# Patient Record
Sex: Male | Born: 1955 | Race: White | Hispanic: No | Marital: Married | Smoking: Never smoker
Health system: Southern US, Community
[De-identification: ages and names within clinical notes are randomized; demographics above are authoritative.]

## PROBLEM LIST (undated history)

## (undated) DIAGNOSIS — I219 Acute myocardial infarction, unspecified: Secondary | ICD-10-CM

## (undated) DIAGNOSIS — M549 Dorsalgia, unspecified: Secondary | ICD-10-CM

## (undated) DIAGNOSIS — T7840XA Allergy, unspecified, initial encounter: Secondary | ICD-10-CM

## (undated) DIAGNOSIS — M1990 Unspecified osteoarthritis, unspecified site: Secondary | ICD-10-CM

## (undated) DIAGNOSIS — G2581 Restless legs syndrome: Secondary | ICD-10-CM

## (undated) DIAGNOSIS — K219 Gastro-esophageal reflux disease without esophagitis: Secondary | ICD-10-CM

## (undated) DIAGNOSIS — F329 Major depressive disorder, single episode, unspecified: Secondary | ICD-10-CM

## (undated) DIAGNOSIS — E162 Hypoglycemia, unspecified: Secondary | ICD-10-CM

## (undated) DIAGNOSIS — F32A Depression, unspecified: Secondary | ICD-10-CM

## (undated) DIAGNOSIS — I509 Heart failure, unspecified: Secondary | ICD-10-CM

## (undated) DIAGNOSIS — G709 Myoneural disorder, unspecified: Secondary | ICD-10-CM

## (undated) DIAGNOSIS — G8929 Other chronic pain: Secondary | ICD-10-CM

## (undated) DIAGNOSIS — F419 Anxiety disorder, unspecified: Secondary | ICD-10-CM

## (undated) DIAGNOSIS — F4310 Post-traumatic stress disorder, unspecified: Secondary | ICD-10-CM

## (undated) HISTORY — DX: Gastro-esophageal reflux disease without esophagitis: K21.9

## (undated) HISTORY — PX: KNEE ARTHROSCOPY: SHX127

## (undated) HISTORY — PX: CERVICAL FUSION: SHX112

## (undated) HISTORY — DX: Other chronic pain: G89.29

## (undated) HISTORY — PX: SPINE SURGERY: SHX786

## (undated) HISTORY — DX: Acute myocardial infarction, unspecified: I21.9

## (undated) HISTORY — PX: ANKLE ARTHROSCOPY: SHX545

## (undated) HISTORY — DX: Restless legs syndrome: G25.81

## (undated) HISTORY — PX: LUMBAR FUSION: SHX111

## (undated) HISTORY — PX: HERNIA REPAIR: SHX51

## (undated) HISTORY — DX: Dorsalgia, unspecified: M54.9

## (undated) HISTORY — DX: Hypoglycemia, unspecified: E16.2

## (undated) HISTORY — DX: Post-traumatic stress disorder, unspecified: F43.10

## (undated) HISTORY — PX: NISSEN FUNDOPLICATION: SHX2091

## (undated) HISTORY — DX: Depression, unspecified: F32.A

## (undated) HISTORY — DX: Unspecified osteoarthritis, unspecified site: M19.90

## (undated) HISTORY — DX: Allergy, unspecified, initial encounter: T78.40XA

## (undated) HISTORY — PX: VASECTOMY: SHX75

## (undated) HISTORY — DX: Heart failure, unspecified: I50.9

## (undated) HISTORY — DX: Major depressive disorder, single episode, unspecified: F32.9

## (undated) HISTORY — DX: Anxiety disorder, unspecified: F41.9

## (undated) HISTORY — DX: Myoneural disorder, unspecified: G70.9

---

## 1998-03-14 HISTORY — PX: CHOLECYSTECTOMY: SHX55

## 2012-05-31 ENCOUNTER — Encounter: Payer: Self-pay | Admitting: Internal Medicine

## 2012-05-31 ENCOUNTER — Other Ambulatory Visit (INDEPENDENT_AMBULATORY_CARE_PROVIDER_SITE_OTHER): Payer: Federal, State, Local not specified - PPO

## 2012-05-31 ENCOUNTER — Ambulatory Visit (INDEPENDENT_AMBULATORY_CARE_PROVIDER_SITE_OTHER): Payer: Federal, State, Local not specified - PPO | Admitting: Internal Medicine

## 2012-05-31 VITALS — BP 118/82 | HR 78 | Temp 97.9°F | Wt 256.0 lb

## 2012-05-31 DIAGNOSIS — Z125 Encounter for screening for malignant neoplasm of prostate: Secondary | ICD-10-CM

## 2012-05-31 DIAGNOSIS — Z0000 Encounter for general adult medical examination without abnormal findings: Secondary | ICD-10-CM

## 2012-05-31 DIAGNOSIS — Z13220 Encounter for screening for lipoid disorders: Secondary | ICD-10-CM

## 2012-05-31 DIAGNOSIS — Z131 Encounter for screening for diabetes mellitus: Secondary | ICD-10-CM

## 2012-05-31 DIAGNOSIS — Z130 Encounter for screening for diseases of the blood and blood-forming organs and certain disorders involving the immune mechanism: Secondary | ICD-10-CM

## 2012-05-31 LAB — PSA: PSA: 1.86 ng/mL (ref 0.10–4.00)

## 2012-05-31 LAB — BASIC METABOLIC PANEL
BUN: 16 mg/dL (ref 6–23)
Chloride: 102 mEq/L (ref 96–112)
Creatinine, Ser: 1.1 mg/dL (ref 0.4–1.5)
GFR: 73.82 mL/min (ref >60.00)
Potassium: 3.8 mEq/L (ref 3.5–5.1)

## 2012-05-31 LAB — LIPID PANEL
Cholesterol: 145 mg/dL (ref 0–200)
LDL Cholesterol: 101 mg/dL — ABNORMAL HIGH (ref 0–99)
Triglycerides: 65 mg/dL (ref 0.0–149.0)
VLDL: 13 mg/dL (ref 0.0–40.0)

## 2012-05-31 LAB — CBC
MCHC: 33.7 g/dL (ref 30.0–36.0)
MCV: 87.5 fl (ref 78.0–100.0)
Platelets: 276 10*3/uL (ref 150.0–400.0)
RDW: 12.9 % (ref 11.5–14.6)

## 2012-05-31 MED ORDER — ZOLPIDEM TARTRATE ER 12.5 MG PO TBCR: 12.5000 mg | EXTENDED_RELEASE_TABLET | Freq: Every evening | ORAL | Status: DC | PRN

## 2012-05-31 MED ORDER — HYOSCYAMINE SULFATE 0.125 MG PO TABS: 0.1250 mg | ORAL_TABLET | Freq: Three times a day (TID) | ORAL | Status: DC | PRN

## 2012-05-31 MED ORDER — DIAZEPAM 10 MG PO TABS: 10.0000 mg | ORAL_TABLET | Freq: Three times a day (TID) | ORAL | Status: DC | PRN

## 2012-05-31 MED ORDER — PROMETHAZINE HCL 25 MG PO TABS: 25.0000 mg | ORAL_TABLET | Freq: Two times a day (BID) | ORAL | Status: DC | PRN

## 2012-05-31 MED ORDER — METHADONE HCL 10 MG PO TABS: 10.0000 mg | ORAL_TABLET | Freq: Four times a day (QID) | ORAL | Status: DC | PRN

## 2012-05-31 NOTE — Patient Instructions (Signed)
Health Maintenance, Males A healthy lifestyle and preventative care can promote health and wellness.  Maintain regular health, dental, and eye exams.  Eat a healthy diet. Foods like vegetables, fruits, whole grains, low-fat dairy products, and lean protein foods contain the nutrients you need without too many calories. Decrease your intake of foods high in solid fats, added sugars, and salt. Get information about a proper diet from your caregiver, if necessary.  Regular physical exercise is one of the most important things you can do for your health. Most adults should get at least 150 minutes of moderate-intensity exercise (any activity that increases your heart rate and causes you to sweat) each week. In addition, most adults need muscle-strengthening exercises on 2 or more days a week.   Maintain a healthy weight. The body mass index (BMI) is a screening tool to identify possible weight problems. It provides an estimate of body fat based on height and weight. Your caregiver can help determine your BMI, and can help you achieve or maintain a healthy weight. For adults 20 years and older:  A BMI below 18.5 is considered underweight.  A BMI of 18.5 to 24.9 is normal.  A BMI of 25 to 29.9 is considered overweight.  A BMI of 30 and above is considered obese.  Maintain normal blood lipids and cholesterol by exercising and minimizing your intake of saturated fat. Eat a balanced diet with plenty of fruits and vegetables. Blood tests for lipids and cholesterol should begin at age 20 and be repeated every 5 years. If your lipid or cholesterol levels are high, you are over 50, or you are a high risk for heart disease, you may need your cholesterol levels checked more frequently.Ongoing high lipid and cholesterol levels should be treated with medicines, if diet and exercise are not effective.  If you smoke, find out from your caregiver how to quit. If you do not use tobacco, do not start.  If you  choose to drink alcohol, do not exceed 2 drinks per day. One drink is considered to be 12 ounces (355 mL) of beer, 5 ounces (148 mL) of wine, or 1.5 ounces (44 mL) of liquor.  Avoid use of street drugs. Do not share needles with anyone. Ask for help if you need support or instructions about stopping the use of drugs.  High blood pressure causes heart disease and increases the risk of stroke. Blood pressure should be checked at least every 1 to 2 years. Ongoing high blood pressure should be treated with medicines if weight loss and exercise are not effective.  If you are 45 to 55 years old, ask your caregiver if you should take aspirin to prevent heart disease.  Diabetes screening involves taking a blood sample to check your fasting blood sugar level. This should be done once every 3 years, after age 45, if you are within normal weight and without risk factors for diabetes. Testing should be considered at a younger age or be carried out more frequently if you are overweight and have at least 1 risk factor for diabetes.  Colorectal cancer can be detected and often prevented. Most routine colorectal cancer screening begins at the age of 50 and continues through age 75. However, your caregiver may recommend screening at an earlier age if you have risk factors for colon cancer. On a yearly basis, your caregiver may provide home test kits to check for hidden blood in the stool. Use of a small camera at the end of a tube,   to directly examine the colon (sigmoidoscopy or colonoscopy), can detect the earliest forms of colorectal cancer. Talk to your caregiver about this at age 50, when routine screening begins. Direct examination of the colon should be repeated every 5 to 10 years through age 75, unless early forms of pre-cancerous polyps or small growths are found.  Hepatitis C blood testing is recommended for all people born from 1945 through 1965 and any individual with known risks for hepatitis C.  Healthy  men should no longer receive prostate-specific antigen (PSA) blood tests as part of routine cancer screening. Consult with your caregiver about prostate cancer screening.  Testicular cancer screening is not recommended for adolescents or adult males who have no symptoms. Screening includes self-exam, caregiver exam, and other screening tests. Consult with your caregiver about any symptoms you have or any concerns you have about testicular cancer.  Practice safe sex. Use condoms and avoid high-risk sexual practices to reduce the spread of sexually transmitted infections (STIs).  Use sunscreen with a sun protection factor (SPF) of 30 or greater. Apply sunscreen liberally and repeatedly throughout the day. You should seek shade when your shadow is shorter than you. Protect yourself by wearing long sleeves, pants, a wide-brimmed hat, and sunglasses year round, whenever you are outdoors.  Notify your caregiver of new moles or changes in moles, especially if there is a change in shape or color. Also notify your caregiver if a mole is larger than the size of a pencil eraser.  A one-time screening for abdominal aortic aneurysm (AAA) and surgical repair of large AAAs by sound wave imaging (ultrasonography) is recommended for ages 65 to 75 years who are current or former smokers.  Stay current with your immunizations. Document Released: 08/27/2007 Document Revised: 05/23/2011 Document Reviewed: 07/26/2010 [employer] Patient Information 2013 [employer].  

## 2012-05-31 NOTE — Assessment & Plan Note (Signed)
Well controlled on current therapy Hycosamine refilled

## 2012-05-31 NOTE — Assessment & Plan Note (Signed)
Well controlled on current therapy Refilled Ambien today

## 2012-05-31 NOTE — Assessment & Plan Note (Signed)
Not well controlled Refilled Valium Reassurance given Pt offered referral to counseling for CBT

## 2012-05-31 NOTE — Progress Notes (Signed)
HPI  Pt presents to the clinic today to establish care. He recently moved here from [facility]. He needs refills of all of his medications but other than that he has no concerns. He is on Methadone QID for chronic back pain. He does take Valium TID for PTSD after retiring from the [employer]. He is fairly stressed right now secondary to a difficult family situation. He would be interested in speaking with a therapist regarding his family situation.  Flu: never Tetanus: more than 10 years: Dentist: as needed Ey doctor: not regualarly Colonoscopy: 2011 (due 2016)  Past Medical History  Diagnosis Date  . Arthritis   . Depression   . Allergy   . Hypoglycemia     Current Outpatient Prescriptions  Medication Sig Dispense Refill  . diazepam (VALIUM) 10 MG tablet Take 1 tablet (10 mg total) by mouth every 8 (eight) hours as needed for anxiety.  90 tablet  3  . hyoscyamine (LEVSIN, ANASPAZ) 0.125 MG tablet Take 1 tablet (0.125 mg total) by mouth 3 (three) times daily as needed for cramping.  30 tablet  3  . methadone (DOLOPHINE) 10 MG tablet Take 1 tablet (10 mg total) by mouth every 6 (six) hours as needed for pain.  120 tablet  0  . promethazine (PHENERGAN) 25 MG tablet Take 1 tablet (25 mg total) by mouth every 12 (twelve) hours as needed for nausea.  30 tablet  2  . zolpidem (AMBIEN CR) 12.5 MG CR tablet Take 1 tablet (12.5 mg total) by mouth at bedtime as needed for sleep.  30 tablet  3   No current facility-administered medications for this visit.    Allergies  Allergen Reactions  . Antihistamines, Chlorpheniramine-Type   . Decongestant (Pseudoephedrine)   . Penicillins     Family History  Problem Relation Age of Onset  . Breast cancer Mother   . Stroke Father   . Hypertension Father   . Diabetes Father   . Alzheimer's disease Father     History   Social History  . Marital Status: Married    Spouse Name: N/A    Number of Children: N/A  . Years of Education: 16    Occupational History  . Retired     Retired Recruitment consultant   Social History Main Topics  . Smoking status: Never Smoker   . Smokeless tobacco: Never Used  . Alcohol Use: Yes  . Drug Use: No  . Sexually Active: Yes    Birth Control/ Protection: Surgical   Other Topics Concern  . Not on file   Social History Narrative  . No narrative on file    ROS:  Constitutional: Pt reports fatigue.Denies fever, malaise, fatigue, headache or abrupt weight changes.  HEENT: Denies eye pain, eye redness, ear pain, ringing in the ears, wax buildup, runny nose, nasal congestion, bloody nose, or sore throat. Respiratory: Denies difficulty breathing, shortness of breath, cough or sputum production.   Cardiovascular: Denies chest pain, chest tightness, palpitations or swelling in the hands or feet.  Gastrointestinal: Denies abdominal pain, bloating, constipation, diarrhea or blood in the stool.  GU: Denies frequency, urgency, pain with urination, blood in urine, odor or discharge. Musculoskeletal: Pt reports chronic back pain. Denies decrease in range of motion, difficulty with gait, muscle pain or joint pain and swelling.  Skin: Denies redness, rashes, lesions or ulcercations.  Neurological: Denies dizziness, difficulty with memory, difficulty with speech or problems with balance and coordination.   No other specific complaints in a  complete review of systems (except as listed in HPI above).  PE:  BP 118/82  Pulse 78  Temp(Src) 97.9 F (36.6 C) (Oral)  Wt 256 lb (116.121 kg)  SpO2 97% Wt Readings from Last 3 Encounters:  05/31/12 256 lb (116.121 kg)    General: Appears his stated age, well developed, well nourished in NAD. HEENT: Head: normal shape and size; Eyes: sclera white, no icterus, conjunctiva pink, PERRLA and EOMs intact; Ears: Tm's gray and intact, normal light reflex; Nose: mucosa pink and moist, septum midline; Throat/Mouth: Teeth present, mucosa pink and moist, no lesions  or ulcerations noted.  Neck: Decreased range of motion secondary to pain. Neck supple, trachea midline. No massses, lumps or thyromegaly present.  Cardiovascular: Normal rate and rhythm. S1,S2 noted.  No murmur, rubs or gallops noted. No JVD or BLE edema. No carotid bruits noted. Pulmonary/Chest: Normal effort and positive vesicular breath sounds. No respiratory distress. No wheezes, rales or ronchi noted.  Abdomen: Soft and nontender. Normal bowel sounds, no bruits noted. No distention or masses noted. Liver, spleen and kidneys non palpable. Musculoskeletal:Decreased flexion of the back secondary to pain. No signs of joint swelling. No difficulty with gait.  Neurological: Alert and oriented. Cranial nerves II-XII intact. Coordination normal. +DTRs bilaterally. Psychiatric: Mood and affect normal. Behavior is normal. Judgment and thought content normal.     Assessment and Plan:  Preventative health Maintenance:  Will obtain basic screening labs today Start a diet and exercise program Pt declines flu and tdap today Will refer eye doctor

## 2012-05-31 NOTE — Assessment & Plan Note (Signed)
Refilled Methadone Controlled on current therapy Will give 120 pills each month, no early refills, no dose increase

## 2012-05-31 NOTE — Assessment & Plan Note (Signed)
Phenergan refilled.

## 2012-07-02 ENCOUNTER — Other Ambulatory Visit: Payer: Self-pay | Admitting: Internal Medicine

## 2012-07-02 ENCOUNTER — Telehealth: Payer: Self-pay | Admitting: Internal Medicine

## 2012-07-02 DIAGNOSIS — M549 Dorsalgia, unspecified: Secondary | ICD-10-CM

## 2012-07-02 DIAGNOSIS — R110 Nausea: Secondary | ICD-10-CM

## 2012-07-02 MED ORDER — PROMETHAZINE HCL 25 MG PO TABS: 25.0000 mg | ORAL_TABLET | Freq: Two times a day (BID) | ORAL | Status: DC | PRN

## 2012-07-02 MED ORDER — METHADONE HCL 10 MG PO TABS: 10.0000 mg | ORAL_TABLET | Freq: Four times a day (QID) | ORAL | Status: DC | PRN

## 2012-07-02 NOTE — Telephone Encounter (Signed)
Rx sent, pt informed. 

## 2012-07-02 NOTE — Telephone Encounter (Signed)
Needs 30 day supply of phenergan called [facility] [address].  Please call patient to let know it has been sent.

## 2012-07-30 ENCOUNTER — Other Ambulatory Visit: Payer: Self-pay | Admitting: Internal Medicine

## 2012-07-30 DIAGNOSIS — G8929 Other chronic pain: Secondary | ICD-10-CM

## 2012-07-30 MED ORDER — METHADONE HCL 10 MG PO TABS: 10.0000 mg | ORAL_TABLET | Freq: Four times a day (QID) | ORAL | Status: DC | PRN

## 2012-08-30 ENCOUNTER — Encounter: Payer: Self-pay | Admitting: Internal Medicine

## 2012-08-30 ENCOUNTER — Ambulatory Visit (INDEPENDENT_AMBULATORY_CARE_PROVIDER_SITE_OTHER): Payer: Federal, State, Local not specified - PPO | Admitting: Internal Medicine

## 2012-08-30 VITALS — BP 108/82 | HR 95 | Temp 98.0°F | Ht 73.0 in | Wt 257.0 lb

## 2012-08-30 DIAGNOSIS — R110 Nausea: Secondary | ICD-10-CM

## 2012-08-30 DIAGNOSIS — F411 Generalized anxiety disorder: Secondary | ICD-10-CM

## 2012-08-30 DIAGNOSIS — R109 Unspecified abdominal pain: Secondary | ICD-10-CM

## 2012-08-30 DIAGNOSIS — G8929 Other chronic pain: Secondary | ICD-10-CM

## 2012-08-30 DIAGNOSIS — J309 Allergic rhinitis, unspecified: Secondary | ICD-10-CM

## 2012-08-30 DIAGNOSIS — G4700 Insomnia, unspecified: Secondary | ICD-10-CM

## 2012-08-30 DIAGNOSIS — M549 Dorsalgia, unspecified: Secondary | ICD-10-CM

## 2012-08-30 MED ORDER — METHADONE HCL 10 MG PO TABS: 10.0000 mg | ORAL_TABLET | Freq: Four times a day (QID) | ORAL | Status: DC | PRN

## 2012-08-30 MED ORDER — PROMETHAZINE HCL 25 MG PO TABS: 25.0000 mg | ORAL_TABLET | Freq: Two times a day (BID) | ORAL | Status: DC | PRN

## 2012-08-30 MED ORDER — ZOLPIDEM TARTRATE ER 12.5 MG PO TBCR: 12.5000 mg | EXTENDED_RELEASE_TABLET | Freq: Every evening | ORAL | Status: DC | PRN

## 2012-08-30 MED ORDER — HYOSCYAMINE SULFATE 0.125 MG PO TABS: 0.1250 mg | ORAL_TABLET | Freq: Three times a day (TID) | ORAL | Status: DC | PRN

## 2012-08-30 MED ORDER — METHYLPREDNISOLONE ACETATE 80 MG/ML IJ SUSP
80.0000 mg | Freq: Once | INTRAMUSCULAR | Status: AC
  Administered 2012-08-30: 80 mg via INTRAMUSCULAR

## 2012-08-30 MED ORDER — DIAZEPAM 10 MG PO TABS: 10.0000 mg | ORAL_TABLET | Freq: Three times a day (TID) | ORAL | Status: DC | PRN

## 2012-08-30 NOTE — Progress Notes (Signed)
Subjective:    Patient ID: Antonio Cabrera, male    DOB: Oct 22, 1955, 57 y.o.   MRN: [id number]  HPI  Pt presents to the clinic today for 3 month f/u of chronic medical conditions.  1- Insomnia: not well controlled on ambien but has tried others, too expensive. He thinks this is related to worsening stress and anxiety. 2- chronic back pain: on methadone, doing well at this time, no complaints. 3- GAD:  On valium. Feels like it is slightly worse secondary to stress. He has no interest is seeing a therapist. 4- Abdominal cramping: well controlled on hycosamine and phenergan   Additionally, he c/o of sinus issues. He c/o fatigue, headache, runny nose with clear drainage and nagging cough. He does have a history of allergies. He is not currently on any antihistamine. He denies fever, chills or body aches. He has not had sick contacts.  Review of Systems      Past Medical History  Diagnosis Date  . Arthritis   . Depression   . Allergy   . Hypoglycemia     Current Outpatient Prescriptions  Medication Sig Dispense Refill  . diazepam (VALIUM) 10 MG tablet Take 1 tablet (10 mg total) by mouth every 8 (eight) hours as needed for anxiety.  90 tablet  3  . hyoscyamine (LEVSIN, ANASPAZ) 0.125 MG tablet Take 1 tablet (0.125 mg total) by mouth 3 (three) times daily as needed for cramping.  30 tablet  3  . methadone (DOLOPHINE) 10 MG tablet Take 1 tablet (10 mg total) by mouth every 6 (six) hours as needed for pain.  120 tablet  0  . promethazine (PHENERGAN) 25 MG tablet Take 1 tablet (25 mg total) by mouth every 12 (twelve) hours as needed for nausea.  30 tablet  1  . zolpidem (AMBIEN CR) 12.5 MG CR tablet Take 1 tablet (12.5 mg total) by mouth at bedtime as needed for sleep.  30 tablet  3   No current facility-administered medications for this visit.    Allergies  Allergen Reactions  . Antihistamines, Chlorpheniramine-Type   . Decongestant (Pseudoephedrine)   . Penicillins     Family  History  Problem Relation Age of Onset  . Breast cancer Mother   . Stroke Father   . Hypertension Father   . Diabetes Father   . Alzheimer's disease Father     History   Social History  . Marital Status: Married    Spouse Name: N/A    Number of Children: N/A  . Years of Education: 16   Occupational History  . Retired     Retired Recruitment consultant   Social History Main Topics  . Smoking status: Never Smoker   . Smokeless tobacco: Never Used  . Alcohol Use: Yes  . Drug Use: No  . Sexually Active: Yes    Birth Control/ Protection: Surgical   Other Topics Concern  . Not on file   Social History Narrative  . No narrative on file     Constitutional: Pt reports fatigue and headache. Denies fever, malaise, or abrupt weight changes.  HEENT: Pt reports runny nose. Denies eye pain, eye redness, ear pain, ringing in the ears, wax buildup,  nasal congestion, bloody nose, or sore throat. Respiratory: Pt reports cough.Denies difficulty breathing, shortness of breath, or sputum production.   Cardiovascular: Denies chest pain, chest tightness, palpitations or swelling in the hands or feet.  Gastrointestinal: Denies abdominal pain, bloating, constipation, diarrhea or blood in the stool. Marland Kitchen  Musculoskeletal: Pt reports back pain. Denies decrease in range of motion, difficulty with gait, muscle pain or joint pain and swelling.  Skin: Denies redness, rashes, lesions or ulcercations.  Neurological: Denies dizziness, difficulty with memory, difficulty with speech or problems with balance and coordination.  Psych: Pt reports insomnia and anxiety. Denies SI/HI.  No other specific complaints in a complete review of systems (except as listed in HPI above).  Objective:   Physical Exam    BP 108/82  Pulse 95  Temp(Src) 98 F (36.7 C) (Oral)  Ht 6\' 1"  (1.854 m)  Wt 257 lb (116.574 kg)  BMI 33.91 kg/m2  SpO2 97% Wt Readings from Last 3 Encounters:  08/30/12 257 lb (116.574 kg)   05/31/12 256 lb (116.121 kg)    General: Appears his stated age, well developed, well nourished in NAD. Skin: Warm, dry and intact. No rashes, lesions or ulcerations noted. HEENT: Head: normal shape and size; Eyes: sclera white, no icterus, conjunctiva pink, PERRLA and EOMs intact; Ears: Tm's gray and intact, normal light reflex; Nose: mucosa pink and moist, septum midline; Throat/Mouth: Teeth present, mucosa pink and moist, no exudate, lesions or ulcerations noted.  Neck: Normal range of motion. Neck supple, trachea midline. No massses, lumps or thyromegaly present.  Cardiovascular: Normal rate and rhythm. S1,S2 noted.  No murmur, rubs or gallops noted. No JVD or BLE edema. No carotid bruits noted. Pulmonary/Chest: Normal effort and positive vesicular breath sounds. No respiratory distress. No wheezes, rales or ronchi noted.  Abdomen: Soft and nontender. Normal bowel sounds, no bruits noted. No distention or masses noted. Liver, spleen and kidneys non palpable. Musculoskeletal: Decreased flexion and extension of the back secondary to pain. No signs of joint swelling. No difficulty with gait.  Neurological: Alert and oriented. Cranial nerves II-XII intact. Coordination normal. +DTRs bilaterally. Psychiatric: Mood anxious and affect normal. Behavior is normal. Judgment and thought content normal.    BMET    Component Value Date/Time   NA 138 05/31/2012 1124   K 3.8 05/31/2012 1124   CL 102 05/31/2012 1124   CO2 28 05/31/2012 1124   GLUCOSE 131* 05/31/2012 1124   BUN 16 05/31/2012 1124   CREATININE 1.1 05/31/2012 1124   CALCIUM 8.9 05/31/2012 1124    Lipid Panel     Component Value Date/Time   CHOL 145 05/31/2012 1124   TRIG 65.0 05/31/2012 1124   HDL 30.70* 05/31/2012 1124   CHOLHDL 5 05/31/2012 1124   VLDL 13.0 05/31/2012 1124   LDLCALC 101* 05/31/2012 1124    CBC    Component Value Date/Time   WBC 7.6 05/31/2012 1124   RBC 5.26 05/31/2012 1124   HGB 15.5 05/31/2012 1124   HCT 46.0  05/31/2012 1124   PLT 276.0 05/31/2012 1124   MCV 87.5 05/31/2012 1124   MCHC 33.7 05/31/2012 1124   RDW 12.9 05/31/2012 1124    Hgb A1C Lab Results  Component Value Date   HGBA1C 5.5 05/31/2012        Assessment & Plan:   Allergic Rhinitis:  Take an OTC antihistamine such as Zyrtec No s/s of infection at this time Will give 80 mg Depo IM today

## 2012-08-30 NOTE — Patient Instructions (Signed)

## 2012-08-31 NOTE — Assessment & Plan Note (Signed)
Well controlled  Continue current meds- refilled today 

## 2012-08-31 NOTE — Assessment & Plan Note (Signed)
Appear to be worse secondary to stress Support given Offered referral for CBT, pt declines Continue current meds

## 2012-08-31 NOTE — Assessment & Plan Note (Signed)
Well controlled  Continue current meds- refilled today

## 2012-08-31 NOTE — Assessment & Plan Note (Signed)
Not well controlled Pt would not try a different medication secondary to cost Continue ambien-refilled today

## 2012-09-07 ENCOUNTER — Other Ambulatory Visit: Payer: Self-pay | Admitting: *Deleted

## 2012-09-07 ENCOUNTER — Telehealth: Payer: Self-pay | Admitting: Internal Medicine

## 2012-09-07 DIAGNOSIS — R110 Nausea: Secondary | ICD-10-CM

## 2012-09-07 MED ORDER — PROMETHAZINE HCL 25 MG PO TABS: 25.0000 mg | ORAL_TABLET | Freq: Two times a day (BID) | ORAL | Status: DC | PRN

## 2012-09-07 NOTE — Telephone Encounter (Signed)
Ok to refill 

## 2012-09-07 NOTE — Telephone Encounter (Signed)
Refilled medication as per Nicki Reaper, NP

## 2012-09-07 NOTE — Telephone Encounter (Signed)
Pt just had the phenegran filled on Sunday.  Pt states he knocked the open bottle in the sink and water got in and disolved the medicine.  He says this is only the second time he has had this to happen.  He is requesting a new RX for it.

## 2012-10-01 ENCOUNTER — Telehealth: Payer: Self-pay | Admitting: *Deleted

## 2012-10-01 ENCOUNTER — Other Ambulatory Visit: Payer: Self-pay | Admitting: Internal Medicine

## 2012-10-01 DIAGNOSIS — M549 Dorsalgia, unspecified: Secondary | ICD-10-CM

## 2012-10-01 DIAGNOSIS — G4700 Insomnia, unspecified: Secondary | ICD-10-CM

## 2012-10-01 MED ORDER — ZOLPIDEM TARTRATE ER 12.5 MG PO TBCR: 12.5000 mg | EXTENDED_RELEASE_TABLET | Freq: Every evening | ORAL | Status: DC | PRN

## 2012-10-01 MED ORDER — METHADONE HCL 10 MG PO TABS: 10.0000 mg | ORAL_TABLET | Freq: Four times a day (QID) | ORAL | Status: DC | PRN

## 2012-10-01 NOTE — Telephone Encounter (Signed)
Prior auth for Ambien CR 12.5mg  has been initiated & completed over the phone. The approval dates are 6.16.14 through 6.16.15.

## 2012-10-27 ENCOUNTER — Other Ambulatory Visit: Payer: Self-pay | Admitting: Internal Medicine

## 2012-10-29 ENCOUNTER — Telehealth: Payer: Self-pay | Admitting: *Deleted

## 2012-10-29 NOTE — Telephone Encounter (Signed)
[facility] Triage Call Report Triage Record Num: [id number] Operator: Valene Bors Patient Name: Antonio Cabrera Call Date & Time: 10/27/2012 3:04:17PM Patient Phone: [phone] PCP: Nicki Reaper Patient Gender: Male PCP Fax : Patient DOB: January 28, 1956 Practice Name: [facility] Reason for Call: Caller: [name]; PCP: Nicki Reaper; CB#: [phone]; Call regarding needing refill on Phenergan 25 mgs 1 PO every 4 hours prn; Last seen in the office on 08/30/12. Medication Questions Protocol and #12, no refills [facility] [address] and spoke to [place] Protocol(s) Used: Medication Questions - Adult Recommended Outcome per Protocol: Speak with Provider or Pharmacist within 24 hours Reason for Outcome: Requests refill of prescribed medication with valid refills; lack of medications does not put patient at clinical risk Care Advice: ~ 08/

## 2012-10-29 NOTE — Telephone Encounter (Signed)
I refilled this am.

## 2012-10-31 ENCOUNTER — Other Ambulatory Visit: Payer: Self-pay | Admitting: Internal Medicine

## 2012-10-31 DIAGNOSIS — G8929 Other chronic pain: Secondary | ICD-10-CM

## 2012-10-31 MED ORDER — METHADONE HCL 10 MG PO TABS: 10.0000 mg | ORAL_TABLET | Freq: Four times a day (QID) | ORAL | Status: DC | PRN

## 2012-11-07 ENCOUNTER — Other Ambulatory Visit: Payer: Self-pay | Admitting: Internal Medicine

## 2012-11-07 ENCOUNTER — Telehealth: Payer: Self-pay | Admitting: *Deleted

## 2012-11-07 MED ORDER — PROMETHAZINE HCL 25 MG/ML IJ SOLN: 12.5000 mg | Freq: Three times a day (TID) | INTRAMUSCULAR | Status: DC | PRN

## 2012-11-07 MED ORDER — SYRINGE (DISPOSABLE) 1 ML MISC: 0.5000 mL | Freq: Three times a day (TID) | Status: DC | PRN

## 2012-11-07 NOTE — Telephone Encounter (Signed)
Yes i want 12.5 mg IM- His wife is a Engineer, civil (consulting) and she usually give his medicine IM when he is unable to keep pills down.

## 2012-11-07 NOTE — Telephone Encounter (Signed)
Faxed info back to pharmacy with regina response...lmb

## 2012-11-07 NOTE — Telephone Encounter (Signed)
Received fax stating need to clarify prescription for promethazine. Rx was sent in for injectable. Pls advise...lmb

## 2012-11-13 ENCOUNTER — Other Ambulatory Visit: Payer: Self-pay | Admitting: Internal Medicine

## 2012-11-13 ENCOUNTER — Telehealth: Payer: Self-pay | Admitting: *Deleted

## 2012-11-13 DIAGNOSIS — K029 Dental caries, unspecified: Secondary | ICD-10-CM

## 2012-11-13 NOTE — Telephone Encounter (Signed)
Unable to reach pt by phone, no answer.

## 2012-11-13 NOTE — Telephone Encounter (Signed)
Most antibiotics do cause nausea/diarrhea. I would wait this out. I will go ahead and refer him to an oral surgeon. They will call him when the appt is set up

## 2012-11-13 NOTE — Telephone Encounter (Signed)
Pt called requesting a referral to an Transport planner.  Pt states provider aware of medications causing tooth decay.  Further states the Keflex [facility] to him by the [facility] is causing upset stomach.  Pt is requesting a return call from [employer].  Please advise

## 2012-11-14 ENCOUNTER — Other Ambulatory Visit: Payer: Self-pay | Admitting: Internal Medicine

## 2012-11-14 ENCOUNTER — Telehealth: Payer: Self-pay

## 2012-11-14 NOTE — Telephone Encounter (Signed)
Called patient and made him aware that his letter was ready to be picked up. He stated that he needs you to specifically put in the letter that he needs to see a dental implant specialist preferably, [facility] in [place], [place] in order for the [facility] to pay. Please advise...ds,cma

## 2012-11-14 NOTE — Telephone Encounter (Signed)
Patient notified that new letter is ready for pick-up...ds,cma

## 2012-11-14 NOTE — Telephone Encounter (Signed)
New letter done

## 2012-11-16 ENCOUNTER — Telehealth: Payer: Self-pay | Admitting: *Deleted

## 2012-11-16 NOTE — Telephone Encounter (Signed)
Pt called states he is having break through pain from the dental abscess.  Pt is requesting pain medication.  Please advise

## 2012-11-16 NOTE — Telephone Encounter (Signed)
Spoke with pt advised of Regina's message. 

## 2012-11-16 NOTE — Telephone Encounter (Signed)
He will need to get that from his dentist. I will not give out pain meds while he is on the methadone

## 2012-11-29 ENCOUNTER — Other Ambulatory Visit: Payer: Self-pay | Admitting: Internal Medicine

## 2012-11-29 DIAGNOSIS — G8929 Other chronic pain: Secondary | ICD-10-CM

## 2012-11-29 DIAGNOSIS — F411 Generalized anxiety disorder: Secondary | ICD-10-CM

## 2012-11-29 MED ORDER — METHADONE HCL 10 MG PO TABS: 10.0000 mg | ORAL_TABLET | Freq: Four times a day (QID) | ORAL | Status: DC | PRN

## 2012-11-29 MED ORDER — DIAZEPAM 10 MG PO TABS: 10.0000 mg | ORAL_TABLET | Freq: Three times a day (TID) | ORAL | Status: DC | PRN

## 2012-11-30 ENCOUNTER — Other Ambulatory Visit: Payer: Self-pay | Admitting: Internal Medicine

## 2012-11-30 ENCOUNTER — Ambulatory Visit: Payer: Federal, State, Local not specified - PPO | Admitting: Internal Medicine

## 2012-11-30 MED ORDER — PROMETHAZINE HCL 25 MG PO TABS: ORAL_TABLET | ORAL | Status: DC

## 2012-12-25 ENCOUNTER — Ambulatory Visit: Payer: Federal, State, Local not specified - PPO | Admitting: Family Medicine

## 2012-12-31 ENCOUNTER — Other Ambulatory Visit: Payer: Self-pay | Admitting: Internal Medicine

## 2012-12-31 ENCOUNTER — Telehealth: Payer: Self-pay

## 2012-12-31 DIAGNOSIS — G8929 Other chronic pain: Secondary | ICD-10-CM

## 2012-12-31 MED ORDER — METHADONE HCL 10 MG PO TABS: 10.0000 mg | ORAL_TABLET | Freq: Four times a day (QID) | ORAL | Status: DC | PRN

## 2012-12-31 NOTE — Telephone Encounter (Signed)
Patient called and informed that prescription was ready for pick up. Patient said that his wife would pick up prescription.

## 2013-01-01 ENCOUNTER — Ambulatory Visit: Payer: Federal, State, Local not specified - PPO | Admitting: Family Medicine

## 2013-01-11 ENCOUNTER — Telehealth: Payer: Self-pay

## 2013-01-11 MED ORDER — PROMETHAZINE HCL 25 MG PO TABS: ORAL_TABLET | ORAL | Status: DC

## 2013-01-11 NOTE — Telephone Encounter (Signed)
Patient was notified, and new rx for the phenegran was sent in per [facility] so he does not run out...ds,cma

## 2013-01-11 NOTE — Telephone Encounter (Signed)
Patient called and stated that he is taking the phenegran for nausea but his teeth are in a lot of pain causing him to be more nauseas. The phenegran is not helping at its current dosage. He would like to know if it can be changed to 2x daily. Please advise.

## 2013-01-11 NOTE — Telephone Encounter (Signed)
Ok to take penergan q12H can increase quantity if needed

## 2013-01-29 ENCOUNTER — Other Ambulatory Visit: Payer: Self-pay | Admitting: Internal Medicine

## 2013-01-29 DIAGNOSIS — G8929 Other chronic pain: Secondary | ICD-10-CM

## 2013-01-29 MED ORDER — METHADONE HCL 10 MG PO TABS: 10.0000 mg | ORAL_TABLET | Freq: Four times a day (QID) | ORAL | Status: DC | PRN

## 2013-01-30 ENCOUNTER — Other Ambulatory Visit: Payer: Self-pay | Admitting: Internal Medicine

## 2013-01-30 ENCOUNTER — Encounter: Payer: Self-pay | Admitting: Internal Medicine

## 2013-01-30 DIAGNOSIS — R109 Unspecified abdominal pain: Secondary | ICD-10-CM

## 2013-01-30 DIAGNOSIS — G4700 Insomnia, unspecified: Secondary | ICD-10-CM

## 2013-01-30 MED ORDER — ZOLPIDEM TARTRATE ER 12.5 MG PO TBCR: 12.5000 mg | EXTENDED_RELEASE_TABLET | Freq: Every evening | ORAL | Status: DC | PRN

## 2013-01-30 MED ORDER — HYOSCYAMINE SULFATE 0.125 MG PO TABS: 0.1250 mg | ORAL_TABLET | Freq: Three times a day (TID) | ORAL | Status: DC | PRN

## 2013-02-05 ENCOUNTER — Other Ambulatory Visit: Payer: Self-pay | Admitting: Internal Medicine

## 2013-02-05 DIAGNOSIS — R109 Unspecified abdominal pain: Secondary | ICD-10-CM

## 2013-02-05 MED ORDER — HYOSCYAMINE SULFATE 0.125 MG PO TABS: 0.1250 mg | ORAL_TABLET | Freq: Three times a day (TID) | ORAL | Status: AC | PRN

## 2013-02-22 ENCOUNTER — Other Ambulatory Visit: Payer: Self-pay | Admitting: Internal Medicine

## 2013-02-25 ENCOUNTER — Telehealth: Payer: Self-pay | Admitting: *Deleted

## 2013-02-25 NOTE — Telephone Encounter (Signed)
Pt called requesting to come to [facility] for UA for Methadone refill.  Pt told he would need to go to [facility] to obtain UA and Rx.  I attempted to advise pt he would need appoint since last OV 9.19.14.  Pt refused appoint stated he didn't have time to fool with this crap.

## 2013-02-26 NOTE — Telephone Encounter (Signed)
He has to be seen here at [facility]. I already advised the pt of this

## 2013-03-01 ENCOUNTER — Other Ambulatory Visit: Payer: Self-pay | Admitting: Internal Medicine

## 2013-03-01 ENCOUNTER — Encounter: Payer: Self-pay | Admitting: *Deleted

## 2013-03-01 DIAGNOSIS — G4700 Insomnia, unspecified: Secondary | ICD-10-CM

## 2013-03-01 DIAGNOSIS — F411 Generalized anxiety disorder: Secondary | ICD-10-CM

## 2013-03-01 DIAGNOSIS — G8929 Other chronic pain: Secondary | ICD-10-CM

## 2013-03-01 MED ORDER — DIAZEPAM 10 MG PO TABS: 10.0000 mg | ORAL_TABLET | Freq: Three times a day (TID) | ORAL | Status: DC | PRN

## 2013-03-01 MED ORDER — METHADONE HCL 10 MG PO TABS: 10.0000 mg | ORAL_TABLET | Freq: Four times a day (QID) | ORAL | Status: DC | PRN

## 2013-03-01 MED ORDER — ZOLPIDEM TARTRATE ER 12.5 MG PO TBCR: 12.5000 mg | EXTENDED_RELEASE_TABLET | Freq: Every evening | ORAL | Status: DC | PRN

## 2013-03-01 NOTE — Telephone Encounter (Signed)
Attempted to contact pt, but unable to reach him. No vm on home phone and mobile vm full. Rx will be available at the front desk for pick up

## 2013-03-05 ENCOUNTER — Ambulatory Visit: Payer: Federal, State, Local not specified - PPO | Admitting: Internal Medicine

## 2013-03-27 ENCOUNTER — Encounter: Payer: Self-pay | Admitting: Family Medicine

## 2013-03-27 ENCOUNTER — Encounter: Payer: Self-pay | Admitting: Internal Medicine

## 2013-03-28 ENCOUNTER — Other Ambulatory Visit: Payer: Self-pay | Admitting: Internal Medicine

## 2013-03-28 ENCOUNTER — Telehealth: Payer: Self-pay

## 2013-03-28 DIAGNOSIS — G8929 Other chronic pain: Secondary | ICD-10-CM

## 2013-03-28 DIAGNOSIS — M549 Dorsalgia, unspecified: Principal | ICD-10-CM

## 2013-03-28 MED ORDER — METHADONE HCL 10 MG PO TABS: 10.0000 mg | ORAL_TABLET | Freq: Four times a day (QID) | ORAL | Status: DC | PRN

## 2013-03-28 NOTE — Telephone Encounter (Signed)
Let pt know his Rx is ready for pick up in front office

## 2013-04-20 ENCOUNTER — Other Ambulatory Visit: Payer: Self-pay | Admitting: Internal Medicine

## 2013-04-22 NOTE — Telephone Encounter (Signed)
Last prescribed 02/22/13 with 1 refill--pt has appt on 04/26/13--please advise

## 2013-04-24 ENCOUNTER — Ambulatory Visit: Payer: Federal, State, Local not specified - PPO | Admitting: Internal Medicine

## 2013-04-26 ENCOUNTER — Ambulatory Visit: Payer: Federal, State, Local not specified - PPO | Admitting: Internal Medicine

## 2013-04-29 ENCOUNTER — Ambulatory Visit: Payer: Federal, State, Local not specified - PPO | Admitting: Internal Medicine

## 2013-05-01 ENCOUNTER — Ambulatory Visit (INDEPENDENT_AMBULATORY_CARE_PROVIDER_SITE_OTHER): Payer: Federal, State, Local not specified - PPO | Admitting: Internal Medicine

## 2013-05-01 ENCOUNTER — Encounter: Payer: Self-pay | Admitting: Internal Medicine

## 2013-05-01 ENCOUNTER — Telehealth: Payer: Self-pay

## 2013-05-01 VITALS — BP 134/90 | HR 108 | Temp 98.3°F | Wt 268.0 lb

## 2013-05-01 DIAGNOSIS — G4700 Insomnia, unspecified: Secondary | ICD-10-CM

## 2013-05-01 DIAGNOSIS — R109 Unspecified abdominal pain: Secondary | ICD-10-CM

## 2013-05-01 DIAGNOSIS — G8929 Other chronic pain: Secondary | ICD-10-CM

## 2013-05-01 DIAGNOSIS — F411 Generalized anxiety disorder: Secondary | ICD-10-CM

## 2013-05-01 DIAGNOSIS — M549 Dorsalgia, unspecified: Secondary | ICD-10-CM

## 2013-05-01 MED ORDER — PROMETHAZINE HCL 25 MG PO TABS: ORAL_TABLET | ORAL | Status: DC

## 2013-05-01 MED ORDER — ESZOPICLONE 2 MG PO TABS: 2.0000 mg | ORAL_TABLET | Freq: Every evening | ORAL | Status: DC | PRN

## 2013-05-01 MED ORDER — METHADONE HCL 10 MG PO TABS
10.0000 mg | ORAL_TABLET | Freq: Four times a day (QID) | ORAL | Status: DC | PRN
Start: 2013-05-01 — End: 2013-05-02

## 2013-05-01 NOTE — Assessment & Plan Note (Signed)
Stable on Methadone He is currently withdrawing because he ran out RX provided today (to be filled 2/20) UDS today

## 2013-05-01 NOTE — Progress Notes (Signed)
Subjective:    Patient ID: Antonio Cabrera, male    DOB: 11-19-1955, 58 y.o.   MRN: [id number]  HPI  Pt presents to the clinic today for follow up of chronic medical conditions.  Arthritis: Has had multiple back/joint surgeries. Has a history of narcotic abuse. On methadone. Does UDS every 3 months.  Anxiety and Depression: He takes valium on a daily basis. Has been under a lot of stress lately. He has declined seeing a psychiatrist. He declines taking any medication for depression.  Abdominal cramping: Symptoms controlled on phenergan and hyoscyamine.  Insomnia: Sleeps poorly with ambien. Other medications have been ineffective. He has never tried [place] and would like to try that today.  Review of Systems      Past Medical History  Diagnosis Date  . Arthritis   . Depression   . Allergy   . Hypoglycemia     Current Outpatient Prescriptions  Medication Sig Dispense Refill  . diazepam (VALIUM) 10 MG tablet Take 1 tablet (10 mg total) by mouth every 8 (eight) hours as needed for anxiety.  90 tablet  3  . hyoscyamine (LEVSIN, ANASPAZ) 0.125 MG tablet Take 1 tablet (0.125 mg total) by mouth 3 (three) times daily as needed for cramping.  30 tablet  3  . methadone (DOLOPHINE) 10 MG tablet Take 1 tablet (10 mg total) by mouth every 6 (six) hours as needed.  120 tablet  0  . promethazine (PHENERGAN) 25 MG tablet TAKE 1 TABLET (25 MG TOTAL) BY MOUTH EVERY 12 (TWELVE) HOURS AS NEEDED FOR NAUSEA.  30 tablet  1  . Syringe, Disposable, 1 ML MISC 0.5 mLs by Does not apply route every 8 (eight) hours as needed.  25 each  0  . zolpidem (AMBIEN CR) 12.5 MG CR tablet Take 1 tablet (12.5 mg total) by mouth at bedtime as needed for sleep.  30 tablet  3   No current facility-administered medications for this visit.    Allergies  Allergen Reactions  . Antihistamines, Chlorpheniramine-Type   . Decongestant [Pseudoephedrine]   . Penicillins     Family History  Problem Relation Age of  Onset  . Breast cancer Mother   . Stroke Father   . Hypertension Father   . Diabetes Father   . Alzheimer's disease Father     History   Social History  . Marital Status: Married    Spouse Name: N/A    Number of Children: N/A  . Years of Education: 16   Occupational History  . Retired     Retired Recruitment consultant   Social History Main Topics  . Smoking status: Never Smoker   . Smokeless tobacco: Never Used  . Alcohol Use: Yes     Comment: occasional-socially  . Drug Use: No  . Sexual Activity: Yes    Birth Control/ Protection: Surgical   Other Topics Concern  . Not on file   Social History Narrative  . No narrative on file     Constitutional: Denies fever, malaise, fatigue, headache or abrupt weight changes.  Respiratory: Denies difficulty breathing, shortness of breath, cough or sputum production.   Cardiovascular: Denies chest pain, chest tightness, palpitations or swelling in the hands or feet.  Gastrointestinal: Pt reports abdominal cramping and nausea. Denies bloating, constipation, diarrhea or blood in the stool.  Musculoskeletal: Pt reports chronic back pain. Denies decrease in range of motion, difficulty with gait, muscle pain or joint pain and swelling.  Neurological: Denies dizziness, difficulty with  memory, difficulty with speech or problems with balance and coordination.   No other specific complaints in a complete review of systems (except as listed in HPI above).  Objective:   Physical Exam   BP 134/90  Pulse 108  Temp(Src) 98.3 F (36.8 C) (Oral)  Wt 268 lb (121.564 kg)  SpO2 98% Wt Readings from Last 3 Encounters:  05/01/13 268 lb (121.564 kg)  08/30/12 257 lb (116.574 kg)  05/31/12 256 lb (116.121 kg)    General: Appears his stated age, pale and sweaty but in NAD. He has been out of his methadone for the past 2 days. Cardiovascular: Normal rate and rhythm. S1,S2 noted.  No murmur, rubs or gallops noted. No JVD or BLE edema. No carotid  bruits noted. Pulmonary/Chest: Normal effort and positive vesicular breath sounds. No respiratory distress. No wheezes, rales or ronchi noted.  Abdomen: Soft and nontender. Normal bowel sounds, no bruits noted. No distention or masses noted. Liver, spleen and kidneys non palpable. Musculoskeletal: Normal range of motion. No signs of joint swelling. No difficulty with gait.  Neurological: Alert and oriented. Cranial nerves II-XII intact. Coordination normal. +DTRs bilaterally. Psychiatric: Mood tearful and affect normal. Behavior is normal. Judgment and thought content normal.     BMET    Component Value Date/Time   NA 138 05/31/2012 1124   K 3.8 05/31/2012 1124   CL 102 05/31/2012 1124   CO2 28 05/31/2012 1124   GLUCOSE 131* 05/31/2012 1124   BUN 16 05/31/2012 1124   CREATININE 1.1 05/31/2012 1124   CALCIUM 8.9 05/31/2012 1124    Lipid Panel     Component Value Date/Time   CHOL 145 05/31/2012 1124   TRIG 65.0 05/31/2012 1124   HDL 30.70* 05/31/2012 1124   CHOLHDL 5 05/31/2012 1124   VLDL 13.0 05/31/2012 1124   LDLCALC 101* 05/31/2012 1124    CBC    Component Value Date/Time   WBC 7.6 05/31/2012 1124   RBC 5.26 05/31/2012 1124   HGB 15.5 05/31/2012 1124   HCT 46.0 05/31/2012 1124   PLT 276.0 05/31/2012 1124   MCV 87.5 05/31/2012 1124   MCHC 33.7 05/31/2012 1124   RDW 12.9 05/31/2012 1124    Hgb A1C Lab Results  Component Value Date   HGBA1C 5.5 05/31/2012        Assessment & Plan:

## 2013-05-01 NOTE — Telephone Encounter (Signed)
Pt seen today given methadone rx today with note can pick on 05/03/13 and pt will be out of med on 05/02/13. Pt said methadone was filled on 03/31/13. Pt request cb and pts wife can pick up new rx 05/02/13. Pt's wife will bring back methadone rx written today when picks up new rx on 05/02/13..Marland Kitchen

## 2013-05-01 NOTE — Progress Notes (Signed)
Pre-visit discussion using our clinic review tool. No additional management support is needed unless otherwise documented below in the visit note.  

## 2013-05-01 NOTE — Telephone Encounter (Signed)
Can you call and verify when this was filled in january

## 2013-05-01 NOTE — Assessment & Plan Note (Signed)
Fair control on vailum I think he would benefit from a SSRI- he declines He also declines psych referral

## 2013-05-01 NOTE — Assessment & Plan Note (Signed)
Controlled in phenergan and hyosycamine He refuses to see GI

## 2013-05-01 NOTE — Assessment & Plan Note (Signed)
Poor response to [place] Will try [employer]

## 2013-05-01 NOTE — Patient Instructions (Addendum)

## 2013-05-01 NOTE — Telephone Encounter (Signed)
Pharmacy confirmed that the Rx was last filled 03/31/13

## 2013-05-02 ENCOUNTER — Other Ambulatory Visit: Payer: Self-pay

## 2013-05-02 ENCOUNTER — Telehealth: Payer: Self-pay | Admitting: Internal Medicine

## 2013-05-02 DIAGNOSIS — G8929 Other chronic pain: Secondary | ICD-10-CM

## 2013-05-02 DIAGNOSIS — M549 Dorsalgia, unspecified: Principal | ICD-10-CM

## 2013-05-02 MED ORDER — METHADONE HCL 10 MG PO TABS: 10.0000 mg | ORAL_TABLET | Freq: Four times a day (QID) | ORAL | Status: DC | PRN

## 2013-05-02 NOTE — Telephone Encounter (Signed)
Ok to print new rx

## 2013-05-02 NOTE — Telephone Encounter (Signed)
Pt is aware and Rx will be placed in front office for pick up

## 2013-05-02 NOTE — Telephone Encounter (Signed)
Patient dismissed from [facility] by Nicki Reaperegina Baity NP , effective May 01, 2013. Dismissal letter sent out by certified / registered mail. DAJ

## 2013-05-23 ENCOUNTER — Encounter: Payer: Self-pay | Admitting: Internal Medicine

## 2013-05-28 ENCOUNTER — Other Ambulatory Visit: Payer: Self-pay | Admitting: Internal Medicine

## 2013-05-28 DIAGNOSIS — M549 Dorsalgia, unspecified: Principal | ICD-10-CM

## 2013-05-28 DIAGNOSIS — G8929 Other chronic pain: Secondary | ICD-10-CM

## 2013-05-28 MED ORDER — METHADONE HCL 10 MG PO TABS: 10.0000 mg | ORAL_TABLET | Freq: Four times a day (QID) | ORAL | Status: DC | PRN

## 2013-05-29 ENCOUNTER — Telehealth: Payer: Self-pay

## 2013-05-29 NOTE — Telephone Encounter (Signed)
Spoke to pt and I let him know his LAST Rx was ready to be picked up

## 2013-07-12 ENCOUNTER — Telehealth: Payer: Self-pay | Admitting: Internal Medicine

## 2013-07-12 NOTE — Telephone Encounter (Signed)
Certified dismissal letter returned as undeliverable, unclaimed, return to sender after three attempts by [employer]. Letter placed in another envelope and resent as 1st class mail which does not require a signature. °07/12/13 DAJ °

## 2013-08-18 ENCOUNTER — Emergency Department (HOSPITAL_COMMUNITY)
Admission: EM | Admit: 2013-08-18 | Discharge: 2013-08-18 | Disposition: A | Discharge status: Home / Self Care | Payer: Federal, State, Local not specified - PPO | Attending: Emergency Medicine | Admitting: Emergency Medicine

## 2013-08-18 ENCOUNTER — Encounter (HOSPITAL_COMMUNITY): Payer: Self-pay | Admitting: Emergency Medicine

## 2013-08-18 DIAGNOSIS — Z862 Personal history of diseases of the blood and blood-forming organs and certain disorders involving the immune mechanism: Secondary | ICD-10-CM | POA: Insufficient documentation

## 2013-08-18 DIAGNOSIS — L03213 Periorbital cellulitis: Secondary | ICD-10-CM

## 2013-08-18 DIAGNOSIS — Z8639 Personal history of other endocrine, nutritional and metabolic disease: Secondary | ICD-10-CM | POA: Insufficient documentation

## 2013-08-18 DIAGNOSIS — F411 Generalized anxiety disorder: Secondary | ICD-10-CM | POA: Insufficient documentation

## 2013-08-18 DIAGNOSIS — Z8739 Personal history of other diseases of the musculoskeletal system and connective tissue: Secondary | ICD-10-CM | POA: Insufficient documentation

## 2013-08-18 DIAGNOSIS — Z880 Allergy status to penicillin: Secondary | ICD-10-CM | POA: Insufficient documentation

## 2013-08-18 DIAGNOSIS — L0100 Impetigo, unspecified: Secondary | ICD-10-CM

## 2013-08-18 DIAGNOSIS — Z79899 Other long term (current) drug therapy: Secondary | ICD-10-CM | POA: Insufficient documentation

## 2013-08-18 DIAGNOSIS — H05019 Cellulitis of unspecified orbit: Secondary | ICD-10-CM | POA: Insufficient documentation

## 2013-08-18 MED ORDER — PREDNISONE 20 MG PO TABS: ORAL_TABLET | ORAL | Status: DC

## 2013-08-18 MED ORDER — SULFAMETHOXAZOLE-TRIMETHOPRIM 400-80 MG PO TABS: 1.0000 | ORAL_TABLET | Freq: Two times a day (BID) | ORAL | Status: DC

## 2013-08-18 MED ORDER — CEPHALEXIN 500 MG PO TABS: 500.0000 mg | ORAL_TABLET | Freq: Three times a day (TID) | ORAL | Status: DC

## 2013-08-18 MED ORDER — CEPHALEXIN 250 MG PO CAPS
500.0000 mg | ORAL_CAPSULE | Freq: Once | ORAL | Status: AC
  Administered 2013-08-18: 500 mg via ORAL
  Filled 2013-08-18: qty 2

## 2013-08-18 MED ORDER — PREDNISONE 20 MG PO TABS
60.0000 mg | ORAL_TABLET | Freq: Once | ORAL | Status: AC
  Administered 2013-08-18: 60 mg via ORAL
  Filled 2013-08-18: qty 3

## 2013-08-18 MED ORDER — HYDROCORTISONE 1 % EX CREA: TOPICAL_CREAM | CUTANEOUS | Status: DC

## 2013-08-18 NOTE — ED Provider Notes (Signed)
CSN: [id number]     Arrival date & time 08/18/13  0218 History   First MD Initiated Contact with Patient 08/18/13 [phone]     Chief Complaint  Patient presents with  . Eye Problem     (Consider location/radiation/quality/duration/timing/severity/associated sxs/prior Treatment) HPI Patient is a 58 yo man who has about 36 hrs of right sided periorbital swelling, erythema and pain. He says he awoke the morning before yesterday with a bite mark (suspected insect bite) over the right inferior orbital skin. He has developed increasingly severe periorbital edema. No occular pain or visual disturbance. Patient has developed a diffuse body rash since then. He has noticed seeping from some of the sites. He has lots of itching. No fever. Says that he is allergic to all decongestants so, has not tried any OTC meds.   Past Medical History  Diagnosis Date  . Arthritis   . Depression   . Allergy   . Hypoglycemia    Past Surgical History  Procedure Laterality Date  . Cholecystectomy  2000  . Cervical fusion      C4-C6  . Lumbar fusion      L5-S1  . Nissen fundoplication    . Ankle arthroscopy    . Knee arthroscopy     Family History  Problem Relation Age of Onset  . Breast cancer Mother   . Stroke Father   . Hypertension Father   . Diabetes Father   . Alzheimer's disease Father    History  Substance Use Topics  . Smoking status: Never Smoker   . Smokeless tobacco: Never Used  . Alcohol Use: Yes     Comment: occasional-socially    Review of Systems 10 point ROS is negative with the exception of sx noted above.     Allergies  Antihistamines, chlorpheniramine-type; Decongestant; and Penicillins  Home Medications   Prior to Admission medications   Medication Sig Start Date End Date Taking? Authorizing Provider  diazepam (VALIUM) 10 MG tablet Take 1 tablet (10 mg total) by mouth every 8 (eight) hours as needed for anxiety. 03/01/13  Yes Nicki Reaper, NP  eszopiclone (LUNESTA) 2 MG  TABS tablet Take 1 tablet (2 mg total) by mouth at bedtime as needed for sleep. Take immediately before bedtime 05/01/13  Yes Nicki Reaper, NP  hyoscyamine (LEVSIN, ANASPAZ) 0.125 MG tablet Take 1 tablet (0.125 mg total) by mouth 3 (three) times daily as needed for cramping. 02/05/13  Yes Nicki Reaper, NP  methadone (DOLOPHINE) 10 MG tablet Take 10 mg by mouth 4 (four) times daily.   Yes Historical Provider, MD  promethazine (PHENERGAN) 25 MG tablet Take 25 mg by mouth every 12 (twelve) hours as needed for nausea or vomiting.   Yes Historical Provider, MD   BP 121/76  Pulse 65  Temp(Src) 98.6 F (37 C)  Resp 20  SpO2 96% Physical Exam Gen: well developed and well nourished appearing Head: NCAT Eyes: PERL, EOMI, diffuse but mild to moderate right periorbital edema and erythema, mild ttp Nose: no epistaixis or rhinorrhea Mouth/throat: mucosa is moist and pink Neck: supple, no stridor Lungs: CTA B, no wheezing, rhonchi or rales CV: RRR, no murmur, extremities appear well perfused.  Abd: soft, notender, nondistended Back: no ttp, no cva ttp Skin: warm and dry Ext: scattered maculopapular rash with honey crusting in some areas - left side of posterior neck, left forearm. Otherwise normal to inspection, no dependent edema Neuro: CN ii-xii grossly intact, no focal deficits Psyche; anxious affect,  calm and  cooperative.   ED Course  Procedures (including critical care time)  MDM   Patient with right periorbital cellulitis and what looks like impetigo. We will tx with hydrocortisone cream and prednisone for associated pruritis as the patient says he is unable to tolerate any antihistamine. We will tx empirically with Keflex and Bactrim. Patient to f/u with PCP tomorrow for recheck. Counseled re: return precautions.     Brandt LoosenJulie Brahim Dolman, MD 08/18/13 [id number]

## 2013-08-18 NOTE — ED Notes (Signed)
MD at bedside. 

## 2013-08-18 NOTE — ED Notes (Addendum)
Pt alert, NAD, calm, interactive, resps e/u, speaking in clear complete sentences, watching TV streamed on smart phone, requesting something to eat (given), pt updated about d/c.

## 2013-08-18 NOTE — ED Notes (Signed)
Pt. presents with right periorbital swelling with itching onset yesterday , itchy rashes at left neck , arms and feet. Denies fever / respirations unlabored . Airway intact.

## 2013-08-28 ENCOUNTER — Encounter (HOSPITAL_COMMUNITY): Payer: Self-pay | Admitting: Emergency Medicine

## 2013-08-28 ENCOUNTER — Emergency Department (HOSPITAL_COMMUNITY)
Admission: EM | Admit: 2013-08-28 | Discharge: 2013-08-28 | Disposition: A | Discharge status: Home / Self Care | Payer: Federal, State, Local not specified - PPO | Attending: Emergency Medicine | Admitting: Emergency Medicine

## 2013-08-28 DIAGNOSIS — Z792 Long term (current) use of antibiotics: Secondary | ICD-10-CM | POA: Insufficient documentation

## 2013-08-28 DIAGNOSIS — Z862 Personal history of diseases of the blood and blood-forming organs and certain disorders involving the immune mechanism: Secondary | ICD-10-CM | POA: Insufficient documentation

## 2013-08-28 DIAGNOSIS — Z8639 Personal history of other endocrine, nutritional and metabolic disease: Secondary | ICD-10-CM | POA: Insufficient documentation

## 2013-08-28 DIAGNOSIS — IMO0002 Reserved for concepts with insufficient information to code with codable children: Secondary | ICD-10-CM | POA: Insufficient documentation

## 2013-08-28 DIAGNOSIS — Z760 Encounter for issue of repeat prescription: Secondary | ICD-10-CM | POA: Insufficient documentation

## 2013-08-28 DIAGNOSIS — M129 Arthropathy, unspecified: Secondary | ICD-10-CM | POA: Insufficient documentation

## 2013-08-28 DIAGNOSIS — F329 Major depressive disorder, single episode, unspecified: Secondary | ICD-10-CM | POA: Insufficient documentation

## 2013-08-28 DIAGNOSIS — F411 Generalized anxiety disorder: Secondary | ICD-10-CM | POA: Insufficient documentation

## 2013-08-28 DIAGNOSIS — M549 Dorsalgia, unspecified: Secondary | ICD-10-CM | POA: Insufficient documentation

## 2013-08-28 DIAGNOSIS — Z880 Allergy status to penicillin: Secondary | ICD-10-CM | POA: Insufficient documentation

## 2013-08-28 DIAGNOSIS — G8929 Other chronic pain: Secondary | ICD-10-CM | POA: Insufficient documentation

## 2013-08-28 DIAGNOSIS — F3289 Other specified depressive episodes: Secondary | ICD-10-CM | POA: Insufficient documentation

## 2013-08-28 DIAGNOSIS — Z79899 Other long term (current) drug therapy: Secondary | ICD-10-CM | POA: Insufficient documentation

## 2013-08-28 MED ORDER — DIAZEPAM 5 MG PO TABS: 5.0000 mg | ORAL_TABLET | Freq: Two times a day (BID) | ORAL | Status: DC

## 2013-08-28 MED ORDER — HYDROXYZINE HCL 25 MG PO TABS: 25.0000 mg | ORAL_TABLET | Freq: Four times a day (QID) | ORAL | Status: DC

## 2013-08-28 MED ORDER — CLONIDINE HCL 0.2 MG PO TABS: 0.1000 mg | ORAL_TABLET | Freq: Two times a day (BID) | ORAL | Status: DC

## 2013-08-28 MED ORDER — ONDANSETRON HCL 4 MG PO TABS: 4.0000 mg | ORAL_TABLET | Freq: Four times a day (QID) | ORAL | Status: DC

## 2013-08-28 NOTE — ED Provider Notes (Signed)
CSN: [id number]     Arrival date & time 08/28/13  1229 History  This chart was scribed for non-physician practitioner, Junius FinnerErin O'Malley, PA-C working with Shanna CiscoMegan E Docherty, MD by Greggory StallionKayla Andersen, ED scribe. This patient was seen in room TR08C/TR08C and the patient's care was started at 1:16 PM.   Chief Complaint  Patient presents with  . Medication Refill   The history is provided by the patient. No language interpreter was used.   HPI Comments: Antonio Cabrera is a 58 y.o. male who presents to the Emergency Department for medication refill. States he needs a refill on his Methadone 10 mg that will run out this evening. Pt takes it for chronic back pain that started 10 years ago. He states his PCP moved so he has tried to find someone in [place] to prescribe it. States Dr. Cleone Slimob was prescribing him methadone but he is currently out of town. Pt has an appointment with pain management on 09/09/13. States if he can not get methadone today, he would like a prescription for another pain medication or something to prevent him from going into withdrawals.    Past Medical History  Diagnosis Date  . Arthritis   . Depression   . Allergy   . Hypoglycemia    Past Surgical History  Procedure Laterality Date  . Cholecystectomy  2000  . Cervical fusion      C4-C6  . Lumbar fusion      L5-S1  . Nissen fundoplication    . Ankle arthroscopy    . Knee arthroscopy     Family History  Problem Relation Age of Onset  . Breast cancer Mother   . Stroke Father   . Hypertension Father   . Diabetes Father   . Alzheimer's disease Father    History  Substance Use Topics  . Smoking status: Never Smoker   . Smokeless tobacco: Never Used  . Alcohol Use: Yes     Comment: occasional-socially    Review of Systems  All other systems reviewed and are negative.  Allergies  Antihistamines, chlorpheniramine-type; Decongestant; and Penicillins  Home Medications   Prior to Admission medications    Medication Sig Start Date End Date Taking? Authorizing Provider  Cephalexin 500 MG tablet Take 1 tablet (500 mg total) by mouth 3 (three) times daily. 08/18/13   Brandt LoosenJulie Manly, MD  cloNIDine (CATAPRES) 0.2 MG tablet Take 0.5 tablets (0.1 mg total) by mouth 2 (two) times daily. 08/28/13   Junius FinnerErin O'Malley, PA-C  diazepam (VALIUM) 10 MG tablet Take 1 tablet (10 mg total) by mouth every 8 (eight) hours as needed for anxiety. 03/01/13   Nicki Reaperegina Baity, NP  diazepam (VALIUM) 5 MG tablet Take 1 tablet (5 mg total) by mouth 2 (two) times daily. 08/28/13   Junius FinnerErin O'Malley, PA-C  eszopiclone (LUNESTA) 2 MG TABS tablet Take 1 tablet (2 mg total) by mouth at bedtime as needed for sleep. Take immediately before bedtime 05/01/13   Nicki Reaperegina Baity, NP  hydrocortisone cream 1 % Apply to affected area 2 times daily 08/18/13   Brandt LoosenJulie Manly, MD  hydrOXYzine (ATARAX/VISTARIL) 25 MG tablet Take 1 tablet (25 mg total) by mouth every 6 (six) hours. 08/28/13   Junius FinnerErin O'Malley, PA-C  hyoscyamine (LEVSIN, ANASPAZ) 0.125 MG tablet Take 1 tablet (0.125 mg total) by mouth 3 (three) times daily as needed for cramping. 02/05/13   Nicki Reaperegina Baity, NP  methadone (DOLOPHINE) 10 MG tablet Take 10 mg by mouth 4 (four) times daily.  Historical Provider, MD  ondansetron (ZOFRAN) 4 MG tablet Take 1 tablet (4 mg total) by mouth every 6 (six) hours. 08/28/13   Junius FinnerErin O'Malley, PA-C  predniSONE (DELTASONE) 20 MG tablet 3 tabs po day one, then 2 tabs daily x 4 days 08/18/13   Brandt LoosenJulie Manly, MD  promethazine (PHENERGAN) 25 MG tablet Take 25 mg by mouth every 12 (twelve) hours as needed for nausea or vomiting.    Historical Provider, MD  sulfamethoxazole-trimethoprim (BACTRIM,SEPTRA) 400-80 MG per tablet Take 1 tablet by mouth 2 (two) times daily. 08/18/13   Brandt LoosenJulie Manly, MD   BP 119/90  Pulse 110  Temp(Src) 98.8 F (37.1 C) (Oral)  Resp 14  SpO2 98%  Physical Exam  Nursing note and vitals reviewed. Constitutional: He is oriented to person, place, and time. He  appears well-developed and well-nourished.  HENT:  Head: Normocephalic and atraumatic.  Eyes: EOM are normal.  Neck: Normal range of motion.  Cardiovascular: Normal rate.   Pulmonary/Chest: Effort normal.  Musculoskeletal: Normal range of motion.  Neurological: He is alert and oriented to person, place, and time.  Skin: Skin is warm and dry.  Psychiatric: His speech is normal and behavior is normal. His mood appears anxious. He is not withdrawn and not actively hallucinating. He exhibits a depressed mood. He expresses no homicidal and no suicidal ideation.   ED Course  Procedures (including critical care time)  DIAGNOSTIC STUDIES: Oxygen Saturation is 99% on RA, normal by my interpretation.    COORDINATION OF CARE: 1:21 PM-Discussed treatment plan which includes a resource guide with pt at bedside and pt agreed to plan. Pt was told that methadone can not be given in the ED and that he can not be given a prescription for narcotics since he is on methadone.   Labs Review Labs Reviewed - No data to display  Imaging Review No results found.   EKG Interpretation None      MDM   Final diagnoses:  Medication refill  Chronic pain    Pt requesting refill of his methadone as he has chronic back pain, states his one doctor is on vacation until July 7th and he cannot be seen at pain management until 09/09/13.  Pt appears anxious and depressed but Vitals: mild tachycardia but otherwise unremarkable. No diaphoresis, no respiratory distress. No evidence of withdrawals.  Discussed pt with Dr. Micheline Mazeocherty who advised pt may have refill on his valium and be given clonidine and zofran for possible withdrawal symptoms as pt states he will finish his methadone tonight. Upon discharge pt also requested medication for his impetigo on his neck that was still pruritic.  Pt states he still has 2 days of prednisone and hydrocortisone cream at home. States he cannot take antihistamines as they cause him to  "go crazy"  Advised pt to continue using hydrocortisone.  Advised to f/u with pain management as scheduled, also provided pt resource guide for additional healthcare clinics and pain management offices in the area. Return precautions provided. Pt verbalized understanding and agreement with tx plan.   I personally performed the services described in this documentation, which was scribed in my presence. The recorded information has been reviewed and is accurate.  Junius FinnerErin O'Malley, PA-C 08/29/13 [phone]

## 2013-08-28 NOTE — Discharge Instructions (Signed)
ED Resources °Pain Management Centers/Resources in the Surrounding Area ° °[facility] °[address] °[place] [address] °336-765-6181 ° °[facility] °[address] °[place] [address] °336-297-2271 ° °[facility] °[address] °[place], [place] [address] °336-282-0132 ° °[facility] °[address] °[place] [address] °336-268-2530 ° °[facility] °[address] °[place] [address] °336-635-6810 ° ° °Emergency Department Resource Guide °1) Find a Doctor and Pay Out of Pocket °Although you won't have to find out who is covered by your insurance plan, it is a good idea to ask around and get recommendations. You will then need to call the office and see if the doctor you have chosen will accept you as a new patient and what types of options they offer for patients who are self-pay. Some doctors offer discounts or will set up payment plans for their patients who do not have insurance, but you will need to ask so you aren't surprised when you get to your appointment. ° °2) Contact Your Local Health Department °Not all health departments have doctors that can see patients for sick visits, but many do, so it is worth a call to see if yours does. If you don't know where your local health department is, you can check in your phone book. The CDC also has a tool to help you locate your state's health department, and many state websites also have listings of all of their local health departments. ° °3) Find a Walk-in Clinic °If your illness is not likely to be very severe or complicated, you may want to try a walk in clinic. These are popping up all over the country in pharmacies, drugstores, and shopping centers. They're usually staffed by nurse practitioners or physician assistants that have been trained to treat common illnesses and complaints. They're  usually fairly quick and inexpensive. However, if you have serious medical issues or chronic medical problems, these are probably not your best option. ° °No Primary Care Doctor: °- Call [facility] at  832-8000 - they can help you locate a primary care doctor that  accepts your insurance, provides certain services, etc. °- Physician Referral Service- 1-800-533-3463 ° °Chronic Pain Problems: °Organization         Address  Phone   Notes  °[facility]  (336) 297-2271 Patients need to be referred by their primary care doctor.  ° °Medication Assistance: °Organization         Address  Phone   Notes  °[place] [facility] [address] °[place],  [address] (336) 641-8030 --Must be a resident of [place] °-- Must have NO insurance coverage whatsoever (no Medicaid/ Medicare, etc.) °-- The pt. MUST have a primary care doctor that directs their care regularly and follows them in the community °  °[facility]  (866) 331-1348   °[employer]  (888) 892-1162   ° °Agencies that provide inexpensive medical care: °Organization         Address                                                       Phone                                                                              Notes  °[facility]  (336) 832-8035   °[facility]    (336) 832-7272   °[facility] [address] °[place], [place] [address] (336) 832-4777   °[facility] [address], °[place] (336) 271-4999   °[facility]    (336) 373-0678   °[facility]    (336) 272-1050   °[facility] ° [address], [place] Phone:  (336) 832-4444, Fax:  (336) 832-4440 Hours of Operation:  9 am - 6 pm, M-F.  Also accepts Medicaid/Medicare and self-pay.  °[facility] ° [address], [place] Phone: (336) 832-3150, Fax: (336) 832-3151. Hours of  Operation:  8:30 am - 5:30 pm, M-F.  Also accepts Medicaid and self-pay.  °[facility] [place] [address], [place] Phone: (336) 878-6027   °[facility] [address], [place], [place] (336)723-1848, Ext. 123 Mondays & Thursdays: 7-9 AM.  First 15 patients are seen on a first come, first serve basis. °  ° °Medicaid-accepting [place] Providers: ° °Organization         Address                                                                       Phone                               Notes  °[facility] [address], [place] (336) 641-2100 Also accepts self-pay patients.  °[facility] [address], [place] ° (336) 856-9996   °[facility] [address], [place] (336) 288-8857   °[facility] [address] [place] [address], [place] (336) 299-7000   °Veita Bland [address], [place]  ° (336) 373-1557 Only accepts [place] Access Medicaid patients after they have their name applied to their card.  ° °Self-Pay (no insurance) in [place]: °  °Organization         Address                                                     Phone               Notes  °Sickle Cell Patients, [facility] [address], [place] (336) 832-1970   °[facility] [address], [place] (336) 832-4400   °[facility] ° [address], [place] (336) 992-4800   °[facility]/Dr. Osei-Bonsu ° [address] [place] [address], [place] or [address], [place] (336) 841-8500 Phone number for both [place] and [place] locations is the same.  °[facility] [address], [place] (336) 299-0000   °[facility] [address] [place] [address],  or [address] (336) 852-7530 °(336) 878-2260   °[facility] [address], [place] (336)  350-1642, phone; (336) 294-5005, fax Sees patients 1st and 3rd Saturday of every month.  Must not qualify for public or private insurance (i.e. Medicaid, Medicare, [place] Health Choice, Veterans' Benefits) • Household income should be no more than 200% of the poverty level •The clinic cannot treat you if you are pregnant or think you are pregnant • Sexually transmitted diseases are not treated at the clinic.  ° ° °Dental Care: °Organization         Address                                  Phone                       Notes  °[facility] [address], [place] (336) 641-6152 Accepts children up to age 21 who are enrolled in Medicaid or [place] Health Choice; pregnant women with a Medicaid card; and children who have applied for Medicaid or [place] Health Choice, but were declined, whose parents can pay a reduced fee at time of service.  °[facility] [place]  [address], [place] (336) 641-7733 Accepts children up to age 21 who are enrolled in Medicaid or [place] Health Choice; pregnant women with a Medicaid card; and children who have applied for Medicaid or [place] Health Choice, but were declined, whose parents can pay a reduced fee at time of service.  °[facility] ° [address], [place] (336) 641-4533 Patients are seen by appointment only. Walk-ins are not accepted. [facility] will see patients 18 years of age and older. °Monday - Tuesday (8am-5pm) °Most Wednesdays (8:30-5pm) °$30 per visit, cash only  °[facility] ° [address], [place] (336) 641-4533 Patients are seen by appointment only. Walk-ins are not accepted. [facility] will see patients 18 years of age and older. °One Wednesday Evening (Monthly: Volunteer Based).  $30 per visit, cash only  °[facility]  (919) 537-3737 for adults; Children under age 4, call Graduate  Pediatric Dentistry at ([phone]) [phone]. Children aged 4-14, please call (919) [phone] to request a pediatric application. ° Dental services are provided in all areas of dental care including fillings, crowns and bridges, complete and partial dentures, implants, gum treatment, root canals, and extractions. Preventive care is also provided. Treatment is provided to both adults and children. °Patients are selected via a lottery and there is often a waiting list. °  °[facility] [address], °[place] ° (336) 763-8833 [email or web address] °  °[facility] [address], [place], [place] (336)723-1848, Ext. 123 Second and Fourth Thursday of each month, opens at 6:30 AM; Clinic ends at 9 AM.  Patients are seen on a first-come first-served basis, and a limited number are seen during each clinic.  ° °[facility] ° [address], [place], [place] (336) 723-7904   Eligibility Requirements °You must have lived in [place], [place], or [place] counties for at least the last three months. °  You cannot be eligible for state or federal sponsored healthcare insurance, including Veterans Administration, Medicaid, or Medicare. °  You generally cannot be eligible for healthcare insurance through your employer.  °  How to apply: °Eligibility screenings are held every   Tuesday and Wednesday afternoon from 1:00 pm until 4:00 pm. You do not need an appointment for the interview!  °[facility] [address], [place], [place] 336-631-2330   °[facility]  336-342-8273   °[facility]  336-703-3100   °[facility]  336-570-6415   ° °Behavioral Health Resources in the Community: °Intensive Outpatient Programs °Organization         Address                                              Phone              Notes  °[facility] [address], [place], [place] 336-878-6098   °[facility] [address], [place], [place] 336-832-9800   °[facility] [address], [place], [place] ° 336-882-2125   °[facility] [address],  °[place], [place] 1-800-853-5163 or 336-641-4981   °Substance Abuse Resources °Organization         Address                                Phone  Notes  °[facility]  336-882-2125   °[facility]  336-784-9470   °[facility]  336-285-9073   °[facility]  336-845-3988   °Residential & Outpatient Substance Abuse Program  1-800-659-3381   °Psychological Services °Organization         Address                                  Phone                Notes  °[facility]  336- 832-9600   °[facility]  336- 378-7881   °[facility] [address], [place] 1-800-853-5163 or 336-641-4981   ° °Mobile Crisis Teams °Organization         Address  Phone  Notes  °[facility], Mobile Crisis Care Unit  1-877-626-1772   °Assertive °[facility] ° [address] [place], [place] 336-834-9664   °Sharon DeEsch [address] °[place] 336-554-5454   ° °Self-Help/Support Groups °Organization         Address                         Phone             Notes  °[facility] - variety of support groups  336- 373-1402 Call for more information  °Narcotics Anonymous (NA), [facility] [address], °[place]  2 meetings at this location  ° °Residential Treatment Programs °Organization         Address                                                    Phone              Notes  °[facility] [address],    °[place]  1-866-801-8205   °[facility] ° [address], [place], [place] 704-293-8524   °[facility] [address], [place] 336-845-3988 Admissions: 8am-3pm M-F  °[facility] [address]    °[place], [place]  336-841-1104   °[facility] [address], [place], [place] 336-379-7146   °[facility] [address]  °[place], [place] 336-285-9073   °[facility] - Intensive Outpatient [address], [place], [place] 336-852-3033   °[facility] [address]  °[place], [place] 1-877-615-2722 or 336-784-9470   °[facility] [address] [place], [place] 336-227-7417 Accepts Medicaid  °[facility] [address]  ° [place] 1-800-659-3381 Substance Abuse/Addiction Treatment  ° °[place] Behavioral Health Resources °Organization         Address                                                            Phone                    Notes  °[facility]  (888) 581-9988   °Julie Brannon, PhD [address] [place], [place]   (336) 349-5553 or (336) 951-0000   °[facility]   [address] °[place], [place] (336) 349-4454   °[facility] [address], [place], [place] (336) 342-8316 Insurance/Medicaid/sponsorship through [facility]  °[facility] [address]                                    [place], [place] (336) 342-8316 Therapy/tele-psych/case  °[facility] [address]  ° [place], [place] (336) 349-2233    °Dr. Arfeen  (336) 349-4544   °[facility] [place]  [employer] [place] [facility] 1) [address], [place] °2) [address], [place] °3)  [address], [place] (336) 349-3220 °(336) 342-7768 ° °(336) 342-8140   °[place] [facility] (336) 342-1394 or (336) 342-3537 (After Hours)    ° ° ° ° °

## 2013-08-28 NOTE — ED Notes (Signed)
Pt refused Atarax Rx. States he is allergic to ALL antihistamines.

## 2013-08-28 NOTE — ED Notes (Signed)
Patient presents today requesting a refill on his Methadone 10mg  that will run out this evening. Patient reports he takes methadone for chronic back pain and has been taking it for 10 years. Patient reports he called his MD's office for a refill and his MD is out of town until July 7th. Patient tearful upon assessment states his wife is in ICU right now.

## 2013-08-30 NOTE — ED Provider Notes (Signed)
Medical screening examination/treatment/procedure(s) were performed by non-physician practitioner and as supervising physician I was immediately available for consultation/collaboration.   Megan E Docherty, MD 08/30/13 0017 

## 2013-09-09 ENCOUNTER — Ambulatory Visit (INDEPENDENT_AMBULATORY_CARE_PROVIDER_SITE_OTHER): Payer: Federal, State, Local not specified - PPO | Admitting: Family Medicine

## 2013-09-09 VITALS — BP 123/85 | HR 103 | Temp 98.0°F | Resp 18 | Ht 74.5 in | Wt 267.0 lb

## 2013-09-09 DIAGNOSIS — Z5189 Encounter for other specified aftercare: Secondary | ICD-10-CM

## 2013-09-09 DIAGNOSIS — F411 Generalized anxiety disorder: Secondary | ICD-10-CM

## 2013-09-09 DIAGNOSIS — F419 Anxiety disorder, unspecified: Secondary | ICD-10-CM

## 2013-09-09 DIAGNOSIS — R52 Pain, unspecified: Secondary | ICD-10-CM

## 2013-09-09 DIAGNOSIS — F329 Major depressive disorder, single episode, unspecified: Secondary | ICD-10-CM

## 2013-09-09 DIAGNOSIS — R202 Paresthesia of skin: Secondary | ICD-10-CM

## 2013-09-09 DIAGNOSIS — R209 Unspecified disturbances of skin sensation: Secondary | ICD-10-CM

## 2013-09-09 DIAGNOSIS — F4310 Post-traumatic stress disorder, unspecified: Secondary | ICD-10-CM

## 2013-09-09 DIAGNOSIS — G2581 Restless legs syndrome: Secondary | ICD-10-CM

## 2013-09-09 DIAGNOSIS — R200 Anesthesia of skin: Secondary | ICD-10-CM

## 2013-09-09 DIAGNOSIS — G894 Chronic pain syndrome: Secondary | ICD-10-CM

## 2013-09-09 DIAGNOSIS — F341 Dysthymic disorder: Secondary | ICD-10-CM

## 2013-09-09 DIAGNOSIS — F32A Depression, unspecified: Secondary | ICD-10-CM

## 2013-09-09 LAB — CBC
HCT: 40.9 % (ref 39.0–52.0)
Hemoglobin: 14 g/dL (ref 13.0–17.0)
MCH: 28.9 pg (ref 26.0–34.0)
MCHC: 34.2 g/dL (ref 30.0–36.0)
MCV: 84.3 fL (ref 78.0–100.0)
Platelets: 282 10*3/uL (ref 150–400)
RBC: 4.85 MIL/uL (ref 4.22–5.81)
RDW: 13.9 % (ref 11.5–15.5)
WBC: 6.7 10*3/uL (ref 4.0–10.5)

## 2013-09-09 LAB — COMPLETE METABOLIC PANEL WITH GFR
ALT: 21 U/L (ref 0–53)
AST: 21 U/L (ref 0–37)
Alkaline Phosphatase: 89 U/L (ref 39–117)
BUN: 17 mg/dL (ref 6–23)
Calcium: 9.5 mg/dL (ref 8.4–10.5)
Chloride: 104 mEq/L (ref 96–112)
Creat: 1.04 mg/dL (ref 0.50–1.35)
Total Bilirubin: 0.6 mg/dL (ref 0.2–1.2)

## 2013-09-09 LAB — COMPLETE METABOLIC PANEL WITHOUT GFR
Albumin: 4.5 g/dL (ref 3.5–5.2)
CO2: 26 meq/L (ref 19–32)
GFR, Est African American: >89 mL/min
GFR, Est Non African American: 80 mL/min
Glucose, Bld: 139 mg/dL — ABNORMAL HIGH (ref 70–99)
Potassium: 4.3 meq/L (ref 3.5–5.3)
Sodium: 140 meq/L (ref 135–145)
Total Protein: 7.3 g/dL (ref 6.0–8.3)

## 2013-09-09 LAB — POCT GLYCOSYLATED HEMOGLOBIN (HGB A1C): Hemoglobin A1C: 5.4

## 2013-09-09 MED ORDER — QUETIAPINE FUMARATE 300 MG PO TABS: 300.0000 mg | ORAL_TABLET | Freq: Every day | ORAL | Status: DC

## 2013-09-09 MED ORDER — HYDROCODONE-ACETAMINOPHEN 5-325 MG PO TABS: 1.0000 | ORAL_TABLET | Freq: Three times a day (TID) | ORAL | Status: DC | PRN

## 2013-09-09 MED ORDER — DIAZEPAM 10 MG PO TABS: 10.0000 mg | ORAL_TABLET | Freq: Two times a day (BID) | ORAL | Status: DC | PRN

## 2013-09-09 MED ORDER — ALPRAZOLAM 1 MG PO TABS: 1.0000 mg | ORAL_TABLET | Freq: Two times a day (BID) | ORAL | Status: DC | PRN

## 2013-09-09 NOTE — Progress Notes (Addendum)
Chief Complaint:  Chief Complaint  Patient presents with  . est pcp    HPI: Antonio Cabrera is a 58 y.o. male who is here to establish care He specifically requested a male provider, initially a PA but since our male PA was not available he was ok with a male physician He is here with his wife, she apparently is a Engineer, civil (consulting).  He would like someone to help him with his narcotic and benzo medication management He has been on methadone for the last 10 years and wants to be off of it. He recently had an opportunity to taper himself off the methadone since his doctor was on vacation and no one inhis office was willing to prescribe him the medicine.   HE has been tapering the methadone 10 mg daily to every other day and his last does was 6 days ago.  He is having some minimal  withdrawal sxs, he is having more leg jerks off of the methadone. He has had arms jerking. He was seeing a psychiatryist Dr Fortino Sic ,  Who rx him seroquel for restless leg and this seems to hlep/  He is also on valium for anxiety/PTSD from service in the [employer].  Currently denies any n/v/acute abd pain ; he has some lower bowel irritation from prior which he takes Levisin for flare ups, He is taking levisin 0.05 mg  Prn  He is taking Xanax or Valium to keep the legs from jumping, he was on Valium 10 mg TID but he has been taking it more like BID prn , he is taking Xanax 1 mg TID prn ( has an old script which he is using)  He has been on narcotics and methadone, he wants to be off of all this, but he would like to taper it down slowly.   He was getting methadone from Orlie Pollen, Charlyne Quale at [place] and prior to that at [facility].  Dr Hyacinth Meeker did his oral surgery for teeth abscess due to methadone use and and did his upper and lower implants so he had an old rx for norco which he was taking  5/325 mg  1 tab QID prn for pain.  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   He has done all types of  Pain meds from duragesics to was on methadone for over 10 years, wants ot be off of this. He feels this unintended  taper has been good for him. He wants to slowly wean off all narcotics.   Past Medical History  Diagnosis Date  . Arthritis   . Depression   . Allergy   . Hypoglycemia   . GERD (gastroesophageal reflux disease)   . PTSD (post-traumatic stress disorder)   . RLS (restless legs syndrome)   . Neuromuscular disorder    Past Surgical History  Procedure Laterality Date  . Cholecystectomy  2000  . Cervical fusion      C4-C6  . Lumbar fusion      L5-S1  . Nissen fundoplication    . Ankle arthroscopy    . Knee arthroscopy    .  Hernia repair    . Vasectomy    . Spine surgery      Dr Laymond Purser ( decease-neurosurgeon in [place] age 37)    History   Social History  . Marital Status: Married    Spouse Name: N/A    Number of Children: N/A  . Years of Education: 16   Occupational History  . Retired     Retired Recruitment consultant   Social History Main Topics  . Smoking status: Never Smoker   . Smokeless tobacco: Never Used  . Alcohol Use: Yes     Comment: occasional-socially  . Drug Use: No  . Sexual Activity: Yes    Birth Control/ Protection: Surgical   Other Topics Concern  . None   Social History Narrative  . None   Family History  Problem Relation Age of Onset  . Breast cancer Mother   . Cancer Mother   . Stroke Father   . Hypertension Father   . Diabetes Father   . Alzheimer's disease Father   . Alcohol abuse Brother    Allergies  Allergen Reactions  . Antihistamines, Chlorpheniramine-Type Other (See Comments)    Skin crawling,  agitation  . Decongestant [Pseudoephedrine] Other (See Comments)    Skin crawling  . Penicillins Rash   Prior to Admission medications   Medication Sig Start Date End Date Taking? Authorizing Provider  diazepam (VALIUM) 10 MG tablet Take 1 tablet (10 mg total) by mouth every 8 (eight) hours as needed for anxiety. 03/01/13  Yes Nicki Reaper, NP  diazepam (VALIUM) 5 MG tablet Take 1 tablet (5 mg total) by mouth 2 (two) times daily. 08/28/13  Yes Junius Finner, PA-C  hyoscyamine (LEVSIN, ANASPAZ) 0.125 MG tablet Take 1 tablet (0.125 mg total) by mouth 3 (three) times daily as needed for cramping. 02/05/13  Yes Nicki Reaper, NP  methadone (DOLOPHINE) 10 MG tablet Take 10 mg by mouth 4 (four) times daily.   Yes Historical Provider, MD  promethazine (PHENERGAN) 25 MG tablet Take 25 mg by mouth every 12 (twelve) hours as needed for nausea or vomiting.   Yes Historical Provider, MD  QUEtiapine (SEROQUEL) 300 MG tablet Take 300 mg by mouth at bedtime.   Yes Historical Provider, MD  Cephalexin 500 MG tablet Take 1 tablet (500 mg total) by mouth 3 (three) times daily. 08/18/13   Brandt Loosen, MD  cloNIDine (CATAPRES) 0.2 MG tablet Take 0.5 tablets (0.1 mg total) by mouth 2 (two) times daily. 08/28/13   Junius Finner, PA-C  eszopiclone (LUNESTA) 2 MG TABS tablet Take 1 tablet (2 mg total) by mouth at bedtime as needed for sleep. Take immediately before bedtime 05/01/13   Nicki Reaper, NP  hydrocortisone cream 1 % Apply to affected area 2 times daily 08/18/13   Brandt Loosen, MD  hydrOXYzine (ATARAX/VISTARIL) 25 MG tablet Take 1 tablet (25 mg total) by mouth every 6 (six) hours. 08/28/13   Junius Finner, PA-C  ondansetron (ZOFRAN) 4 MG tablet Take 1 tablet (4 mg total) by mouth every 6 (six) hours. 08/28/13   Junius Finner, PA-C  predniSONE (DELTASONE) 20 MG tablet 3 tabs po day one, then 2 tabs daily x 4 days 08/18/13   Brandt Loosen, MD  sulfamethoxazole-trimethoprim (BACTRIM,SEPTRA) 400-80 MG per tablet Take 1 tablet by  mouth 2 (two) times daily. 08/18/13   Brandt Loosen, MD     ROS: The patient denies fevers, chills, night sweats, unintentional weight loss, chest pain, palpitations, wheezing, dyspnea on exertion, nausea, vomiting,  abdominal pain, dysuria, hematuria, melena, acute  numbness, weakness, or tingling.   All other systems have been reviewed and were otherwise negative with the exception of those mentioned in the HPI and as above.    PHYSICAL EXAM: Filed Vitals:   09/09/13 1143  BP: 123/85  Pulse: 103  Temp: 98 F (36.7 C)  Resp: 18   Filed Vitals:   09/09/13 1143  Height: 6' 2.5" (1.892 m)  Weight: 267 lb (121.11 kg)   Body mass index is 33.83 kg/(m^2).  General: Alert, no acute distress HEENT:  Normocephalic, atraumatic, oropharynx patent. EOMI, PERRLA Cardiovascular:  Sinus tach, Regular rhythm, no rubs murmurs or gallops.  No Carotid bruits, radial pulse intact. No pedal edema.  Respiratory: Clear to auscultation bilaterally.  No wheezes, rales, or rhonchi.  No cyanosis, no use of accessory musculature GI: No organomegaly, abdomen is soft and non-tender, positive bowel sounds.  No masses. Skin: No rashes. Neurologic: Facial musculature symmetric. Psychiatric: Patient is appropriate throughout our interaction. Lymphatic: No cervical lymphadenopathy Musculoskeletal: Gait intact  Complained of right posterior knee pain while in room, exam was unremarkable for swelling, neg lachman, stable to varus/valgus stress, neg mcmurray. Full ROM, + tenderness posterior knee and also + crepitus.    LABS: Results for orders placed in visit on 09/09/13  POCT GLYCOSYLATED HEMOGLOBIN (HGB A1C)      Result Value Ref Range   Hemoglobin A1C 5.4       EKG/XRAY:   Primary read [facility].   ASSESSMENT/PLAN: Encounter Diagnoses  Name Primary?  . Pain management Yes  . Chronic pain syndrome   . PTSD (post-traumatic stress disorder)   . Restless leg   . Anxiety and  depression   . Numbness and tingling   . 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 He states he is sick and tired of being tied down to methadone and does not want it to consume his daily function, ie have to rely on it and count pills when he is on vacation to make sure he ahs enough etc  They would liek referral to [facility] pain management but I am not sure this is in their best interest. I suggested the [facility] for substance dependence and abuse  Because they have therapists and a psychiatrist available who can help him with his other mental health needs besides just pain management. He got a little upset about this when I first mentioned it since he does not think he has a substance abuse problesm and doe snot need to be in rehab, I tried to tell him that is not the point, the services are in my opinion more well rounded at [facility] rather than [facility] I have given them the contact info and they will  let me know where they would like to be referred to: Refer to [facility] and [facility] pedning their decision He will be given norco, xanax and valium prn while we are doing this transition off methadone. He understands that I Do Not  refill methadone.  Prescription Drug Profile was pulled and no illegal/strange activities were noted.  Withdrawal sx precautions  given, will taper his methadone like he has been doing Refilled  seroquel for restless leg.  Labs pending: CBC, CMP, TSH, LDL,  F/u in 1 month   More than 45 min face to face time spent with patient  Gross sideeffects, risk and benefits, and alternatives of medications d/w patient. Patient is aware that all medications have potential sideeffects and we are unable to predict every sideeffect or drug-drug interaction that may occur.  LE, THAO PHUONG, DO 09/09/2013 2:15 PM    09/10/13 Spoke with Hansel StarlingAdrienne at [facility] Mr Earl GalaOsborne was dismissed because he would pick up rx for methadone but would cx or  no showed for appts too many time.

## 2013-09-09 NOTE — Patient Instructions (Signed)
[facility]  [phone] [facility]   Narcotic Withdrawal When drug use interferes with normal living activities and relationships, it is abuse. Abuse includes problems with family and friends. Psychological dependence has developed when your mind tells you that the drug is needed. This is usually followed by a physical dependence in which you need more of the drug to get the same feeling or "high." This is known as addiction or chemical dependency. Risk is greater when chemical dependency exists in the family. SYMPTOMS  When tolerance to narcotics has developed, stopping of the narcotic suddenly can cause uncomfortable physical symptoms. Most of the time these are mild and consist of shakes or jitters (tremors) in the hands,a rapid heart rate, rapid breathing, and temperature. Sometimes these symptoms are associated with anxiety, panic attacks, and bad dreams. Other symptoms include:  Irritability.  Anxiety.  Runny nose.  "Goose flesh."  Diarrhea.  Feeling sick to the stomach (nauseous).  Muscle spasms.  Sleeplessness.  Chills.  Sweats.  Drug cravings.  Confusion. The severity of the withdrawal is based on the individual and varies from person to person. Many people choose to continue using narcotics to get rid of the discomfort of withdrawal. They also use to try to feel normal. TREATMENT  Quitting an addiction means stopping use of all chemicals. This is hard but may save your life. With continual drug use, possible outcomes are often loss of self respect and esteem, violence, death, and eventually prison if the use of narcotics has led to the death of another. Addiction cannot be cured, but it can be stopped. This often requires outside help and the care of professionals. Most hospitals and clinics can refer you to a specialized care center. It is not necessary for you to go through the uncomfortable symptoms of withdrawal. Your caregiver can provide you with medications  that will help you through this difficult period. Try to avoid situations, friends, or alcohol, which may have made it possible for you to continue using narcotics in the past. Learn how to say no! HOME CARE INSTRUCTIONS   Drink fluids, get plenty of rest, and take hot baths.  Medicines may be prescribed to help control withdrawal symptoms.  Over-the-counter medicines may be helpful to control diarrhea or an upset stomach.  If your problems resulted from taking prescription pain medicines, make sure you have a follow-up visit with your caregiver within the next few days. Be open about this problem.  If you are dependent or addicted to street drugs, contact a local drug and alcohol treatment center or Narcotics Anonymous.  Have someone with you to monitor your symptoms.  Engage in healthy activities with friends who do not use drugs.  Stay away from the drug scene. It takes a long period of time to overcome addictions to all drugs. There may be times when you feel as though you want to use. Following loss of a physical addiction and going through withdrawal, you have conquered the most difficult part of getting rid of an addiction. Gradually, you will have a lessening of the craving that is telling you that you need narcotics to feel normal. Call your caregiver or a member of your support group if more support is needed. Learn who to talk to in your family and among your friends so that during these periods you can receive outside help. SEEK IMMEDIATE MEDICAL CARE IF:   You have vomiting that cannot be controlled, especially if you cannot keep liquids down.  You are seeing  things or hearing voices that are not really there (hallucinating).  You have a seizure. Document Released: 05/21/2002 Document Revised: 05/23/2011 Document Reviewed: 02/24/2008 [facility] Patient Information 2015 [employer] [place]. This information is not intended to replace advice given to you by your health care provider.  Make sure you discuss any questions you have with your health care provider.

## 2013-09-10 DIAGNOSIS — F419 Anxiety disorder, unspecified: Secondary | ICD-10-CM

## 2013-09-10 DIAGNOSIS — F329 Major depressive disorder, single episode, unspecified: Secondary | ICD-10-CM | POA: Insufficient documentation

## 2013-09-10 DIAGNOSIS — F32A Depression, unspecified: Secondary | ICD-10-CM | POA: Insufficient documentation

## 2013-09-10 DIAGNOSIS — G894 Chronic pain syndrome: Secondary | ICD-10-CM | POA: Insufficient documentation

## 2013-09-10 DIAGNOSIS — F4310 Post-traumatic stress disorder, unspecified: Secondary | ICD-10-CM | POA: Insufficient documentation

## 2013-09-10 DIAGNOSIS — G2581 Restless legs syndrome: Secondary | ICD-10-CM | POA: Insufficient documentation

## 2013-09-10 LAB — TSH: TSH: 1.557 u[IU]/mL (ref 0.350–4.500)

## 2013-09-17 ENCOUNTER — Telehealth: Payer: Self-pay

## 2013-09-17 ENCOUNTER — Encounter: Payer: Self-pay | Admitting: Family Medicine

## 2013-09-17 ENCOUNTER — Other Ambulatory Visit: Payer: Self-pay | Admitting: Family Medicine

## 2013-09-17 MED ORDER — ROPINIROLE HCL 1 MG PO TABS: ORAL_TABLET | ORAL | Status: DC

## 2013-09-17 MED ORDER — ALPRAZOLAM 1 MG PO TABS: 1.0000 mg | ORAL_TABLET | Freq: Two times a day (BID) | ORAL | Status: DC | PRN

## 2013-09-17 NOTE — Telephone Encounter (Signed)
Spoke with patient about labs and med and [facility] referral. Will refill xanax since he had been taking it TID to help with restless leg , will rx requip, f.u as directed

## 2013-09-17 NOTE — Telephone Encounter (Signed)
requip works for shaky legs and arms  Dr. Avon GullyLe [place] [facility]    [phone]

## 2013-09-17 NOTE — Telephone Encounter (Signed)
Pt states he was on Requip and it helped with the jerky feeling in his arms and legs. He is off Methadone and taking Norco now. He has restless arms and legs when he lays down at night. He would like to have this prescribed for him also. The Xanax PRN has been helping. He needs a refill. He is having to take 1 in the morning and he has to take another one later on. He is trying to manage with only taking one but the second dose is helping better.

## 2013-09-18 ENCOUNTER — Telehealth: Payer: Self-pay | Admitting: *Deleted

## 2013-09-18 NOTE — Telephone Encounter (Signed)
Pt is at the pharmacy. He states that he did not pick up the prescription sent to the pharmacy for his Xanax on 6/29. He wants the prescription to read "take 1 tablet three times a day as needed". He does not handle his medications. He has his wife dispense medication to him.  Spoke to the pharmacist and she states that they have his signature for the purchase and pick up of the Xanax on 6/29 at 422pm. Pharmacy will not fill this medication early unless the sig is written TID. Pharmacist states the patient is acting "strange".

## 2013-09-18 NOTE — Telephone Encounter (Signed)
Called pt and he prefers if we fax the Rx to pharm and mail him the letter that Dr Conley RollsLe has written. Done.

## 2013-09-18 NOTE — Telephone Encounter (Signed)
Patient called- Pharmacy did not have the script called in. He was very anxious and upset. Called in Xanax refill per Dr. Conley RollsLe note in other phone message. Pt advised.

## 2013-09-19 ENCOUNTER — Telehealth: Payer: Self-pay | Admitting: Family Medicine

## 2013-09-19 MED ORDER — ALPRAZOLAM 1 MG PO TABS: ORAL_TABLET | ORAL | Status: DC

## 2013-09-19 NOTE — Telephone Encounter (Signed)
I will call him when I get a chance to speak the [facility], he is already on valium 10 mg daily, he is taking Xanax BID  Scheduled /prn for restless leg. He was recently rx rrquip, it should work so he does not need increase in his frequency at this time. I will call him once I sort this out.

## 2013-09-19 NOTE — Telephone Encounter (Signed)
Spoke with patient. He is still having minimal tremors in legs but improwving and has tremors in hands still and requip has not fully kicked in yet. Will just try a trial of Xanax 1 mg  Once in the day and 2 mg at night for 15 days with 1 refill and then will taper to BID . He states he is not abusing it, I checked narcotic profile , no illegal activities. Spoke with [facility], they will assess him first and then see if he is a good fit. IF not then [facility] have [facility]. Patient is aware that [facility] , number given to him, he needs to speak to [facility] at the [facility].   Advise him of our policy for callbacks and  rx refills  48-72 hrs and to be mindful of drug policy and to be nice to our staff.   Called in new xanax rx, asked melanie to discard old one on file for BID.  Xanax 1 mg 1 tab in the Am, 2 tabs in the pm, #45, with ! Refill 2 weeks from pickup.

## 2013-09-19 NOTE — Telephone Encounter (Signed)
Spoke to pt, he states the requip helped with his leg jerks, however his arms still tremble. He is requesting an additional xanax to take in the daytime to relieve the jerky feeling in his arms. He states his wife is concerned due to his constant complaining of the issue.  Please advise. Pt # [phone]

## 2013-09-29 ENCOUNTER — Telehealth: Payer: Self-pay

## 2013-09-29 NOTE — Telephone Encounter (Signed)
This pt has never been seen here, in [facility] nor in [facility]. Tried to call pt. VM not set up

## 2013-09-29 NOTE — Telephone Encounter (Signed)
Wrong pt. Message put in correct chart. Disregard this msg.

## 2013-09-29 NOTE — Telephone Encounter (Signed)
Patient is having various issues and would like to increase dosage amount for me requip (2ml) and sequel (300ml). He would also like to take these medications twice a day. Patient states he's going out of town tomorrow. You can reach him at [phone] or wife [phone]. [facility] [address] 

## 2013-09-29 NOTE — Telephone Encounter (Signed)
Patient is having various issues and would like to increase dosage amount for me requip (2ml) and sequel (300ml). He would also like to take these medications twice a day. Patient states he's going out of town tomorrow. You can reach him at [phone] or wife [phone]. [facility] [address]

## 2013-09-30 ENCOUNTER — Telehealth: Payer: Self-pay

## 2013-09-30 ENCOUNTER — Other Ambulatory Visit: Payer: Self-pay | Admitting: Family Medicine

## 2013-09-30 MED ORDER — ROPINIROLE HCL 2 MG PO TABS: 2.0000 mg | ORAL_TABLET | Freq: Two times a day (BID) | ORAL | Status: DC

## 2013-09-30 NOTE — Progress Notes (Signed)
Spoke with patient and wife about sxs, will increase to help with restless leg form 1 mg BID to 2 mg BID, I will hold off on increasing seroquel, maximum 400 mg is what I feel comfortable with. [facility]

## 2013-09-30 NOTE — Telephone Encounter (Signed)
PATIENT STATES HE HAS BEEN TRYING FOR 2 DAYS TO GET IN TOUCH WITH DR. Conley RollsLE. HE SAID SOMEONE TRIED TO CALL HIM YESTERDAY, BUT HIS PHONE DIED. HE SAID HE WOULD LIKE TO GET AN IMMEDIATE CALL BACK. BEST PHONE [phone] (CELL) MBC

## 2013-10-06 ENCOUNTER — Other Ambulatory Visit: Payer: Self-pay | Admitting: Family Medicine

## 2013-10-07 NOTE — Telephone Encounter (Signed)
Dr Conley RollsLe, see also phone messages forwarded to you. Evidently pt is wanting to change dosage.

## 2013-10-11 ENCOUNTER — Ambulatory Visit (INDEPENDENT_AMBULATORY_CARE_PROVIDER_SITE_OTHER): Payer: Federal, State, Local not specified - PPO | Admitting: Family Medicine

## 2013-10-11 ENCOUNTER — Telehealth: Payer: Self-pay

## 2013-10-11 VITALS — BP 128/84 | HR 86 | Temp 98.3°F | Resp 16 | Ht 76.0 in | Wt 266.4 lb

## 2013-10-11 DIAGNOSIS — J3489 Other specified disorders of nose and nasal sinuses: Secondary | ICD-10-CM

## 2013-10-11 DIAGNOSIS — G4700 Insomnia, unspecified: Secondary | ICD-10-CM

## 2013-10-11 DIAGNOSIS — F411 Generalized anxiety disorder: Secondary | ICD-10-CM

## 2013-10-11 DIAGNOSIS — R0981 Nasal congestion: Secondary | ICD-10-CM

## 2013-10-11 DIAGNOSIS — F4310 Post-traumatic stress disorder, unspecified: Secondary | ICD-10-CM

## 2013-10-11 DIAGNOSIS — Z4802 Encounter for removal of sutures: Secondary | ICD-10-CM

## 2013-10-11 DIAGNOSIS — R52 Pain, unspecified: Secondary | ICD-10-CM

## 2013-10-11 DIAGNOSIS — G894 Chronic pain syndrome: Secondary | ICD-10-CM

## 2013-10-11 DIAGNOSIS — Z5189 Encounter for other specified aftercare: Secondary | ICD-10-CM

## 2013-10-11 MED ORDER — HYDROCODONE-ACETAMINOPHEN 5-325 MG PO TABS: 1.0000 | ORAL_TABLET | Freq: Three times a day (TID) | ORAL | Status: DC | PRN

## 2013-10-11 MED ORDER — DIAZEPAM 10 MG PO TABS: 10.0000 mg | ORAL_TABLET | Freq: Two times a day (BID) | ORAL | Status: DC | PRN

## 2013-10-11 MED ORDER — FLUTICASONE PROPIONATE 50 MCG/ACT NA SUSP: 2.0000 | Freq: Every day | NASAL | Status: DC
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
## 2013-10-11 NOTE — Telephone Encounter (Signed)
This is entered into the wrong pt chart.

## 2013-10-17 ENCOUNTER — Telehealth: Payer: Self-pay
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
## 2013-10-17 NOTE — Telephone Encounter (Signed)
Patient says he had a 8:00 appointment with [facility] this morning and when he got there they were not open and had a sign that they would open at 9:00am. Patient received a call from director and they exchanged words. Director has discharged patient from that office because of his "no shows" and todays experience. Patient would like to be referred somewhere else that Dr Conley RollsLe recommends, but not to pain management.

## 2013-10-18 NOTE — Telephone Encounter (Signed)
Please advise 

## 2013-10-18 NOTE — Telephone Encounter (Signed)
Will call him later, I have to find out what places are available.

## 2013-10-25 ENCOUNTER — Ambulatory Visit (INDEPENDENT_AMBULATORY_CARE_PROVIDER_SITE_OTHER): Payer: Federal, State, Local not specified - PPO

## 2013-10-25 ENCOUNTER — Ambulatory Visit (INDEPENDENT_AMBULATORY_CARE_PROVIDER_SITE_OTHER): Payer: Federal, State, Local not specified - PPO | Admitting: Family Medicine

## 2013-10-25 VITALS — BP 122/86 | HR 88 | Temp 98.3°F | Resp 16 | Ht 74.5 in | Wt 279.6 lb

## 2013-10-25 DIAGNOSIS — H8111 Benign paroxysmal vertigo, right ear: Secondary | ICD-10-CM

## 2013-10-25 DIAGNOSIS — J209 Acute bronchitis, unspecified: Secondary | ICD-10-CM

## 2013-10-25 DIAGNOSIS — H8110 Benign paroxysmal vertigo, unspecified ear: Secondary | ICD-10-CM

## 2013-10-25 DIAGNOSIS — R059 Cough, unspecified: Secondary | ICD-10-CM

## 2013-10-25 DIAGNOSIS — R05 Cough: Secondary | ICD-10-CM

## 2013-10-25 LAB — POCT CBC
Granulocyte percent: 77.8 %G (ref 37–80)
HCT, POC: 42.1 % — AB (ref 43.5–53.7)
HEMOGLOBIN: 13.2 g/dL — AB (ref 14.1–18.1)
LYMPH, POC: 1.2 (ref 0.6–3.4)
MCH, POC: 26.3 pg — AB (ref 27–31.2)
MCHC: 31.2 g/dL — AB (ref 31.8–35.4)
MCV: 84.2 fL (ref 80–97)
MID (cbc): 0.4 (ref 0–0.9)
MPV: 7 fL (ref 0–99.8)
POC Granulocyte: 5.8 (ref 2–6.9)
POC LYMPH PERCENT: 16.2 %L (ref 10–50)
POC MID %: 6 % (ref 0–12)
Platelet Count, POC: 315 10*3/uL (ref 142–424)
RBC: 5 M/uL (ref 4.69–6.13)
RDW, POC: 14.2 %
WBC: 7.5 10*3/uL (ref 4.6–10.2)

## 2013-10-25 LAB — POCT SEDIMENTATION RATE: POCT SED RATE: 26 mm/h — AB (ref 0–22)

## 2013-10-25 MED ORDER — MECLIZINE HCL 25 MG PO TABS: 25.0000 mg | ORAL_TABLET | Freq: Three times a day (TID) | ORAL | Status: DC | PRN

## 2013-10-25 MED ORDER — GUAIFENESIN ER 1200 MG PO TB12: 1.0000 | ORAL_TABLET | Freq: Two times a day (BID) | ORAL | Status: DC | PRN

## 2013-10-25 MED ORDER — PROMETHAZINE-DM 6.25-15 MG/5ML PO SYRP: 5.0000 mL | ORAL_SOLUTION | Freq: Four times a day (QID) | ORAL | Status: DC | PRN

## 2013-10-25 MED ORDER — AZITHROMYCIN 500 MG PO TABS: 500.0000 mg | ORAL_TABLET | Freq: Every day | ORAL | Status: DC

## 2013-10-25 NOTE — Progress Notes (Signed)
Subjective:  This chart was scribed for Antonio SorensonEva Jahlon Baines, MD by Charline BillsEssence Howell, ED Scribe. The patient was seen in room 1. Patient's care was started at 8:39 AM.   Patient ID: Antonio ChampagneJames T Vandenbos Jr., male    DOB: 1955/10/20, 58 y.o.   MRN: [id number]  Chief Complaint  Patient presents with  . other    pt states for x3 weeks now he has been coughing up small amounts of blood.  pt states he has pain in the center of his chest and feels like he is going to vomit from the coughing and blood;  Has been having fever, Chills   . Headache    pt states he has been having off/on throbbing headaches around the temple.  . Eye Problem    pt states his vision is sometimes affected from the throbbing headaches and eyes ache  . Dizziness    pt says he gets dizzy a lot.  feels like the room is spinning in circles and he has to quickly sit down  . Chest Pain    pt says when he is inhaling his upper torso hurts   HPI HPI Comments: Antonio ChampagneJames T Lewey Jr. is a 58 y.o. male, with a h/o PTSD, who presents to the [facility] complaining of hemoptysis onset this morning. Pt reports "spraying" bright red colored blood in a napkin with coughing. He states that hemoptysis began today following a persistent dry cough, sore throat, sneezing, nasal congestion, postnasal drip 2 weeks ago. Pt was seen by Hamilton Caprihao Le, DO on 10/11/13 for unrelated symptoms. He also spoke with her via phone this morning 1 hour PTA; pt complained of coughing up blood with forceful cough. There was no dizziness, CP, SOB at that time. During office visit, pt reports associated SOB, chest pain with deep breaths, dizziness, nausea, visual disturbances, eye pain, HA. He denies wheezing, vomiting, loss of appetite. Pt has tried Flonase for symptoms. He also denies tobacco and alcohol use. No hx tobacco use. No sick contacts. Allergy to PCN.  Past Medical History  Diagnosis Date  . Arthritis   . Depression   . Allergy   . Hypoglycemia   . GERD  (gastroesophageal reflux disease)   . PTSD (post-traumatic stress disorder)   . RLS (restless legs syndrome)   . Neuromuscular disorder    Current Outpatient Prescriptions on File Prior to Visit  Medication Sig Dispense Refill  . ALPRAZolam (XANAX) 1 MG tablet Take 1 tab PO in the AM , then take 2 tabs PO in the PM. May refill 1 refill 2 weeks from initial pickup.  45 tablet  1  . diazepam (VALIUM) 10 MG tablet Take 1 tablet (10 mg total) by mouth every 12 (twelve) hours as needed for anxiety.  60 tablet  0  . fluticasone (FLONASE) 50 MCG/ACT nasal spray Place 2 sprays into both nostrils daily.  16 g  0  . HYDROcodone-acetaminophen (NORCO) 5-325 MG per tablet Take 1 tablet by mouth every 8 (eight) hours as needed for moderate pain.  90 tablet  0  . hyoscyamine (LEVSIN, ANASPAZ) 0.125 MG tablet Take 1 tablet (0.125 mg total) by mouth 3 (three) times daily as needed for cramping.  30 tablet  3  . QUEtiapine (SEROQUEL) 300 MG tablet TAKE 1 TABLET BY MOUTH AT BEDTIME  30 tablet  0  . rOPINIRole (REQUIP) 2 MG tablet Take 1 tablet (2 mg total) by mouth 2 (two) times daily.  60 tablet  3   No current facility-administered medications on file prior to visit.   Allergies  Allergen Reactions  . Antihistamines, Chlorpheniramine-Type Other (See Comments)    Skin crawling, agitation  . Decongestant [Pseudoephedrine] Other (See Comments)    Skin crawling  . Penicillins Rash   Review of Systems  Constitutional: Positive for appetite change.  Eyes: Positive for pain and visual disturbance.  Respiratory: Positive for cough and shortness of breath. Negative for wheezing.   Cardiovascular: Positive for chest pain (with coughing).  Gastrointestinal: Positive for nausea. Negative for vomiting.  Neurological: Positive for dizziness and headaches.   Triage Vitals: BP 110/60  Pulse 90  Temp(Src) 98.3 F (36.8 C) (Oral)  Resp 16  Ht 6' 2.5" (1.892 m)  Wt 279 lb 9.6 oz (126.826 kg)  BMI 35.43 kg/m2   SpO2 95% Objective:   Physical Exam  Nursing note and vitals reviewed. Constitutional: He is oriented to person, place, and time. He appears well-developed and well-nourished.  HENT:  Head: Normocephalic and atraumatic.  Right Ear: Tympanic membrane normal.  Left Ear: Tympanic membrane normal.  Nose: Nose normal.  Mouth/Throat: Oropharynx is clear and moist. Mucous membranes are dry.  Eyes: Conjunctivae and EOM are normal.  Neck: Neck supple. No mass and no thyromegaly present.  Cardiovascular: Regular rhythm, S1 normal and S2 normal.  Tachycardia present.   No murmur heard. Pulmonary/Chest: Effort normal. He has rhonchi.  Diffuse inspiratory and expiratory rhonchi, worse in L lower lobe  Musculoskeletal: Normal range of motion.  Lymphadenopathy:       Head (right side): No submandibular, no preauricular and no posterior auricular adenopathy present.       Head (left side): No submandibular, no preauricular and no posterior auricular adenopathy present.    He has cervical adenopathy (bilateral).       Right cervical: No superficial cervical and no posterior cervical adenopathy present.      Left cervical: No superficial cervical and no posterior cervical adenopathy present.  Neurological: He is alert and oriented to person, place, and time.  Positive Dix-hallpike on L Did not perform Dix-hallpike on R due to severity of symptoms and presyncope  Skin: Skin is warm and dry.  Psychiatric: He has a normal mood and affect. His behavior is normal.   NEG ORTHOSTATICS  Results for orders placed in visit on 10/25/13  POCT CBC      Result Value Ref Range   WBC 7.5  4.6 - 10.2 K/uL   Lymph, poc 1.2  0.6 - 3.4   POC LYMPH PERCENT 16.2  10 - 50 %L   MID (cbc) 0.4  0 - 0.9   POC MID % 6.0  0 - 12 %M   POC Granulocyte 5.8  2 - 6.9   Granulocyte percent 77.8  37 - 80 %G   RBC 5.00  4.69 - 6.13 M/uL   Hemoglobin 13.2 (*) 14.1 - 18.1 g/dL   HCT, POC 16.1 (*) 09.6 - 53.7 %   MCV 84.2  80 -  97 fL   MCH, POC 26.3 (*) 27 - 31.2 pg   MCHC 31.2 (*) 31.8 - 35.4 g/dL   RDW, POC 04.5     Platelet Count, POC 315  142 - 424 K/uL   MPV 7.0  0 - 99.8 fL  POCT SEDIMENTATION RATE      Result Value Ref Range   POCT SED RATE 26 (*) 0 - 22 mm/hr    [facility] reading (PRIMARY) by  Dr.  Laquasia Pincus. CXR: no acute abnormalities     EXAM: CHEST 2 VIEW  COMPARISON: None.  FINDINGS: The heart size and mediastinal contours are within normal limits. Both lungs are clear. The visualized skeletal structures are unremarkable.  IMPRESSION: No active cardiopulmonary disease.  Assessment & Plan:   Cough - Plan: POCT CBC, POCT SEDIMENTATION RATE, POCT glucose (manual entry), DG Chest 2 View  Acute bronchitis, unspecified organism - I suspect most of pt's vomiting is from PND and coughing to the point of regurg - hgb slightly decreased but pt coughing and emesis in office of clear sputum and clear liquid/water only - no bile, no blood, no coffee-ground. RTC or to ER immed if hemoptysis or hematemesis recur but otherwise recheck in clinic in 3d.  BPPV - advised scheduled antivert x 3d then recheck in clinic - did NOT review Epley's maneuvers w/ pt as hoping most of his sxs will resolve with his acute illness but will need to reconsider if sxs persist.  Pt advised NO DRIVING until sxs resolve.  Meds ordered this encounter  Medications  . azithromycin (ZITHROMAX) 500 MG tablet    Sig: Take 1 tablet (500 mg total) by mouth daily.    Dispense:  3 tablet    Refill:  0  . promethazine-dextromethorphan (PROMETHAZINE-DM) 6.25-15 MG/5ML syrup    Sig: Take 5 mLs by mouth 4 (four) times daily as needed for cough.    Dispense:  120 mL    Refill:  0  . meclizine (ANTIVERT) 25 MG tablet    Sig: Take 1 tablet (25 mg total) by mouth 3 (three) times daily as needed for dizziness.    Dispense:  30 tablet    Refill:  0  . Guaifenesin (MUCINEX MAXIMUM STRENGTH) 1200 MG TB12    Sig: Take 1 tablet (1,200 mg total) by  mouth every 12 (twelve) hours as needed.    Dispense:  14 tablet    Refill:  1    I personally performed the services described in this documentation, which was scribed in my presence. The recorded information has been reviewed and considered, and addended by me as needed.  Antonio Sorenson, MD MPH

## 2013-10-25 NOTE — Telephone Encounter (Signed)
Spoke with patient this morning at 7:29 am, he had called because was coughing up blood occ. He has no Cp or dizziness, advise that if he was coughing up blood and feels like it is a GI Bleed then needs to go to ER, if he is stable without dizziness, CP, SOB and just coughing up blood with forceful cough then can f/u with us. He states he does not need to go to ER. I advise him that we are open at 8 am. I am in the appt clinic so would prefer him to be seen on the urgent side so we can get labs and xrays more easily. 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 Other than that he is doing pretty well compared to where he was at. I would advise that only pain meds and Valium  be given by me and he should have enough. He is a complicated and can be challenging. I am looking for another center like the [facility] for him at this time if he asks.

## 2013-10-25 NOTE — Patient Instructions (Signed)
Please recheck in office with Dr. Conley Rolls on 8/17 at 9 a.m. to make sure you are feeling better though if you continue to vomit or cough up blood you need to come back and see me this weekend.  Acute Bronchitis Bronchitis is inflammation of the airways that extend from the windpipe into the lungs (bronchi). The inflammation often causes mucus to develop. This leads to a cough, which is the most common symptom of bronchitis.  In acute bronchitis, the condition usually develops suddenly and goes away over time, usually in a couple weeks. Smoking, allergies, and asthma can make bronchitis worse. Repeated episodes of bronchitis may cause further lung problems.  CAUSES Acute bronchitis is most often caused by the same virus that causes a cold. The virus can spread from person to person (contagious) through coughing, sneezing, and touching contaminated objects. SIGNS AND SYMPTOMS   Cough.   Fever.   Coughing up mucus.   Body aches.   Chest congestion.   Chills.   Shortness of breath.   Sore throat.  DIAGNOSIS  Acute bronchitis is usually diagnosed through a physical exam. Your health care provider will also ask you questions about your medical history. Tests, such as chest X-rays, are sometimes done to rule out other conditions.  TREATMENT  Acute bronchitis usually goes away in a couple weeks. Oftentimes, no medical treatment is necessary. Medicines are sometimes given for relief of fever or cough. Antibiotic medicines are usually not needed but may be prescribed in certain situations. In some cases, an inhaler may be recommended to help reduce shortness of breath and control the cough. A cool mist vaporizer may also be used to help thin bronchial secretions and make it easier to clear the chest.  HOME CARE INSTRUCTIONS  Get plenty of rest.   Drink enough fluids to keep your urine clear or pale yellow (unless you have a medical condition that requires fluid restriction). Increasing  fluids may help thin your respiratory secretions (sputum) and reduce chest congestion, and it will prevent dehydration.   Take medicines only as directed by your health care provider.  If you were prescribed an antibiotic medicine, finish it all even if you start to feel better.  Avoid smoking and secondhand smoke. Exposure to cigarette smoke or irritating chemicals will make bronchitis worse. If you are a smoker, consider using nicotine gum or skin patches to help control withdrawal symptoms. Quitting smoking will help your lungs heal faster.   Reduce the chances of another bout of acute bronchitis by washing your hands frequently, avoiding people with cold symptoms, and trying not to touch your hands to your mouth, nose, or eyes.   Keep all follow-up visits as directed by your health care provider.  SEEK MEDICAL CARE IF: Your symptoms do not improve after 1 week of treatment.  SEEK IMMEDIATE MEDICAL CARE IF:  You develop an increased fever or chills.   You have chest pain.   You have severe shortness of breath.  You have bloody sputum.   You develop dehydration.  You faint or repeatedly feel like you are going to pass out.  You develop repeated vomiting.  You develop a severe headache. MAKE SURE YOU:   Understand these instructions.  Will watch your condition.  Will get help right away if you are not doing well or get worse. Document Released: 04/07/2004 Document Revised: 07/15/2013 Document Reviewed: 08/21/2012 [facility] Patient Information 2015 [place], [place]. This information is not intended to replace advice given to you by your  health care provider. Make sure you discuss any questions you have with your health care provider. Vertigo Vertigo means you feel like you or your surroundings are moving when they are not. Vertigo can be dangerous if it occurs when you are at work, driving, or performing difficult activities.  CAUSES  Vertigo occurs when there is a  conflict of signals sent to your brain from the visual and sensory systems in your body. There are many different causes of vertigo, including:  Infections, especially in the inner ear.  A bad reaction to a drug or misuse of alcohol and medicines.  Withdrawal from drugs or alcohol.  Rapidly changing positions, such as lying down or rolling over in bed.  A migraine headache.  Decreased blood flow to the brain.  Increased pressure in the brain from a head injury, infection, tumor, or bleeding. SYMPTOMS  You may feel as though the world is spinning around or you are falling to the ground. Because your balance is upset, vertigo can cause nausea and vomiting. You may have involuntary eye movements (nystagmus). DIAGNOSIS  Vertigo is usually diagnosed by physical exam. If the cause of your vertigo is unknown, your caregiver may perform imaging tests, such as an MRI scan (magnetic resonance imaging). TREATMENT  Most cases of vertigo resolve on their own, without treatment. Depending on the cause, your caregiver may prescribe certain medicines. If your vertigo is related to body position issues, your caregiver may recommend movements or procedures to correct the problem. In rare cases, if your vertigo is caused by certain inner ear problems, you may need surgery. HOME CARE INSTRUCTIONS   Follow your caregiver's instructions.  Avoid driving.  Avoid operating heavy machinery.  Avoid performing any tasks that would be dangerous to you or others during a vertigo episode.  Tell your caregiver if you notice that certain medicines seem to be causing your vertigo. Some of the medicines used to treat vertigo episodes can actually make them worse in some people. SEEK IMMEDIATE MEDICAL CARE IF:   Your medicines do not relieve your vertigo or are making it worse.  You develop problems with talking, walking, weakness, or using your arms, hands, or legs.  You develop severe headaches.  Your nausea  or vomiting continues or gets worse.  You develop visual changes.  A family member notices behavioral changes.  Your condition gets worse. MAKE SURE YOU:  Understand these instructions.  Will watch your condition.  Will get help right away if you are not doing well or get worse. Document Released: 12/08/2004 Document Revised: 05/23/2011 Document Reviewed: 09/16/2010 [facility] Patient Information 2015 [place]. This information is not intended to replace advice given to you by your health care provider. Make sure you discuss any questions you have with your health care provider.

## 2013-10-28 ENCOUNTER — Ambulatory Visit (INDEPENDENT_AMBULATORY_CARE_PROVIDER_SITE_OTHER): Payer: Federal, State, Local not specified - PPO | Admitting: Family Medicine

## 2013-10-28 VITALS — BP 110/72 | HR 84 | Temp 97.4°F | Resp 18 | Ht 75.0 in | Wt 277.0 lb

## 2013-10-28 DIAGNOSIS — Z130 Encounter for screening for diseases of the blood and blood-forming organs and certain disorders involving the immune mechanism: Secondary | ICD-10-CM

## 2013-10-28 DIAGNOSIS — J209 Acute bronchitis, unspecified: Secondary | ICD-10-CM

## 2013-10-28 DIAGNOSIS — H8110 Benign paroxysmal vertigo, unspecified ear: Secondary | ICD-10-CM

## 2013-10-28 DIAGNOSIS — F4310 Post-traumatic stress disorder, unspecified: Secondary | ICD-10-CM

## 2013-10-28 DIAGNOSIS — G894 Chronic pain syndrome: Secondary | ICD-10-CM

## 2013-10-28 DIAGNOSIS — F411 Generalized anxiety disorder: Secondary | ICD-10-CM

## 2013-10-28 MED ORDER — DIAZEPAM 10 MG PO TABS: 10.0000 mg | ORAL_TABLET | Freq: Two times a day (BID) | ORAL | Status: DC | PRN

## 2013-10-28 MED ORDER — QUETIAPINE FUMARATE 300 MG PO TABS: ORAL_TABLET | ORAL | Status: DC

## 2013-10-28 MED ORDER — HYDROCODONE-ACETAMINOPHEN 5-325 MG PO TABS: 1.0000 | ORAL_TABLET | Freq: Three times a day (TID) | ORAL | Status: DC | PRN

## 2013-10-28 NOTE — Progress Notes (Signed)
Chief Complaint:  Chief Complaint  Patient presents with  . Follow-up    HPI: Antonio Cabrera. is a 58 y.o. male who is here for : 1. Bronchitis and BPPV recheck, he is doing better  2. 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  He states he missed the appt, He told that he missed to many appt but he was taking care of his mom in [place], her health and house issues, and the fact that his brother was defrauding his mother and it took longer than expected since he had to get lawyers involved, and then subsequently his cousin committed suicide, he thought it was ok with that he missed it since he spoke to the receptionist. This was prior to the inciident where he came at 8 am, he left because doors said office opens at 9 am, He then got a call from the director per patient and was dimissed after they "got into it" over the phone. So he is in need of a center for psych counseling that has some specialty in substance abuse.   Below is my note from our first encounter in 08/2013. This will be my 3rd office visit with Antonio Cabrera is a 58 y.o. male who is here to establish care  He specifically requested a male provider, initially a PA but since our male PA was not available he was ok with a male physician  He is here with his wife, she apparently is a Engineer, civil (consulting).  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 He recently had an opportunity to taper himself off the methadone since his doctor was on vacation and no one inhis office was willing to prescribe him the medicine.  HE has been tapering the methadone 10 mg daily to every other day and his last does was 6 days ago.  He is having some minimal withdrawal sxs, he is having more leg jerks off of the methadone. He has had arms jerking. He was seeing a psychiatryist Dr Fortino Sic , Who rx him seroquel for restless leg and this seems to hlep/  He is also on valium for anxiety/PTSD from service in the [employer].  Currently denies any n/v/acute abd pain ; he has some lower bowel irritation from prior which he takes Levisin for flare ups, He is taking levisin 0.05 mg Prn  He is taking Xanax or Valium to keep the legs from jumping, he was on Valium 10 mg TID but he has been taking it more like BID prn , he is taking Xanax 1 mg TID prn ( has an old script which he is using)  He has been on narcotics  and methadone, he wants to be off of all this, but he would like to taper it down slowly.  He was getting methadone from Orlie Pollen, Charlyne Quale at [place] and prior to that at [facility].  Dr Hyacinth Meeker did his oral surgery for teeth abscess due to methadone use and and did his upper and lower implants so he had an old rx for norco which he was taking 5/325 mg 1 tab QID prn for pain.  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  He has done all types of Pain meds from duragesics to was on methadone for over 10 years, wants ot be off of this. He feels  this unintended taper has been good for him. He wants to slowly wean off all narcotics.    Past Medical History  Diagnosis Date  . Arthritis   . Depression   . Allergy   . Hypoglycemia   . GERD (gastroesophageal reflux disease)   . PTSD (post-traumatic stress disorder)   . RLS (restless legs syndrome)   . Neuromuscular disorder    Past Surgical History  Procedure Laterality Date  . Cholecystectomy  2000  . Cervical fusion      C4-C6  . Lumbar fusion      L5-S1  . Nissen fundoplication    . Ankle arthroscopy    . Knee arthroscopy    . Hernia repair    . Vasectomy    . Spine surgery      Dr Laymond Purser ( decease-neurosurgeon in [place] age 89)    History   Social History  . Marital Status: Married    Spouse Name: N/A    Number of Children: N/A  . Years of Education: 16   Occupational History  . Retired     Retired Recruitment consultant   Social History Main Topics  . Smoking status: Never Smoker   . Smokeless tobacco: Never Used  . Alcohol Use: Yes     Comment: occasional-socially  . Drug Use: No  . Sexual Activity: Yes    Birth Control/ Protection: Surgical   Other Topics Concern  . None   Social History Narrative  . None   Family History  Problem Relation Age of Onset  . Breast cancer Mother   . Cancer Mother   . Stroke Father   . Hypertension Father   . Diabetes Father   . Alzheimer's disease Father   . Alcohol abuse Brother    Allergies  Allergen Reactions  . Antihistamines, Chlorpheniramine-Type Other (See Comments)    Skin crawling, agitation  . Decongestant [Pseudoephedrine] Other (See Comments)    Skin crawling  . Penicillins Rash   Prior to Admission medications   Medication Sig Start Date End Date Taking? Authorizing Provider  ALPRAZolam Prudy Feeler) 1 MG tablet Take 1 tab PO in the AM , then take 2 tabs PO in the PM. May refill 1 refill 2 weeks from initial pickup. 09/19/13   Thao P Le, DO  azithromycin (ZITHROMAX) 500 MG tablet Take 1  tablet (500 mg total) by mouth daily. 10/25/13   Levell July  Clelia Croft, MD  diazepam (VALIUM) 10 MG tablet Take 1 tablet (10 mg total) by mouth every 12 (twelve) hours as needed for anxiety. 10/11/13   Thao P Le, DO  fluticasone (FLONASE) 50 MCG/ACT nasal spray Place 2 sprays into both nostrils daily. 10/11/13   Thao P Le, DO  Guaifenesin (MUCINEX MAXIMUM STRENGTH) 1200 MG TB12 Take 1 tablet (1,200 mg total) by mouth every 12 (twelve) hours as needed. 10/25/13   Sherren Mocha, MD  HYDROcodone-acetaminophen (NORCO) 5-325 MG per tablet Take 1 tablet by mouth every 8 (eight) hours as needed for moderate pain. 10/11/13   Thao P Le, DO  hyoscyamine (LEVSIN, ANASPAZ) 0.125 MG tablet Take 1 tablet (0.125 mg total) by mouth 3 (three) times daily as needed for cramping. 02/05/13   Nicki Reaper, NP  meclizine (ANTIVERT) 25 MG tablet Take 1 tablet (25 mg total) by mouth 3 (three) times daily as needed for dizziness. 10/25/13   Sherren Mocha, MD  promethazine-dextromethorphan (PROMETHAZINE-DM) 6.25-15 MG/5ML syrup Take 5 mLs by mouth 4 (four) times daily as needed for cough. 10/25/13   Sherren Mocha, MD  QUEtiapine (SEROQUEL) 300 MG tablet TAKE 1 TABLET BY MOUTH AT BEDTIME    Thao P Le, DO  rOPINIRole (REQUIP) 2 MG tablet Take 1 tablet (2 mg total) by mouth 2 (two) times daily. 09/30/13   Thao P Le, DO     ROS: The patient denies fevers, chills, night sweats, unintentional weight loss, chest pain, palpitations, wheezing, dyspnea on exertion, nausea, vomiting, abdominal pain, dysuria, hematuria, melena, acute numbness, weakness, or tingling.   All other systems have been reviewed and were otherwise negative with the exception of those mentioned in the HPI and as above.    PHYSICAL EXAM: Filed Vitals:   10/28/13 1016  BP: 110/72  Pulse: 84  Temp: 97.4 F (36.3 C)  Resp: 18   Filed Vitals:   10/28/13 1016  Height: 6\' 3"  (1.905 m)  Weight: 277 lb (125.646 kg)   Body mass index is 34.62 kg/(m^2).  General: Alert, no acute  distress HEENT:  Normocephalic, atraumatic, oropharynx patent. EOMI, PERRLA, tm normal, no exudates Cardiovascular:  Regular rate and rhythm, no rubs murmurs or gallops.  No Carotid bruits, radial pulse intact. No pedal edema.  Respiratory: Clear to auscultation bilaterally.  No wheezes, rales, or rhonchi.  No cyanosis, no use of accessory musculature GI: No organomegaly, abdomen is soft and non-tender, positive bowel sounds.  No masses. Skin: No rashes. Neurologic: Facial musculature symmetric. Gilberto Better neg Psychiatric: Patient is appropriate throughout our interaction. Lymphatic: No cervical lymphadenopathy Musculoskeletal: Gait intact.   LABS: Results for orders placed in visit on 10/25/13  POCT CBC      Result Value Ref Range   WBC 7.5  4.6 - 10.2 K/uL   Lymph, poc 1.2  0.6 - 3.4   POC LYMPH PERCENT 16.2  10 - 50 %L   MID (cbc) 0.4  0 - 0.9   POC MID % 6.0  0 - 12 %M   POC Granulocyte 5.8  2 - 6.9   Granulocyte percent 77.8  37 - 80 %G   RBC 5.00  4.69 - 6.13 M/uL   Hemoglobin 13.2 (*) 14.1 - 18.1 g/dL   HCT, POC 28.4 (*) 13.2 - 53.7 %   MCV 84.2  80 - 97 fL   MCH, POC 26.3 (*) 27 - 31.2 pg   MCHC 31.2 (*) 31.8 - 35.4 g/dL   RDW, POC 14.2  Platelet Count, POC 315  142 - 424 K/uL   MPV 7.0  0 - 99.8 fL  POCT SEDIMENTATION RATE      Result Value Ref Range   POCT SED RATE 26 (*) 0 - 22 mm/hr     EKG/XRAY:   [facility]   ASSESSMENT/PLAN: Encounter Diagnoses  Name Primary?  . Generalized anxiety disorder Yes  . Chronic pain syndrome   . BPPV (benign paroxysmal positional vertigo), unspecified laterality   . Acute bronchitis, unspecified organism   . Screening for deficiency anemia    Refilled medicines: Valium, Norco ( predated them to be filled when they are due) We talked about xanax and he does not think he needs it which is good, we had done a trial since he was having difficulty with anxiety, tremors/RLS  and insomnia due  to recent events with his cousin/brother and was able tog et some additional rest with prn Xanax but I think this has resolved now with him being on the Seroquel  I will refer him to neuropsychiatry for twitching arms and all of his other comorbidities including PTSD, GAD, insomnia, chronic pain, h/o methadone use for pain control. [facility] HE is on high doses of antipsychotic meds which I am not comfortable increasing. Currently he is doing much netter than when I initially saw him in 08/2013. He is more lucid and appears to be in less pain. 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 He continues to be methadone free.  F/u in 1 month  Gross sideeffects, risk and benefits, and alternatives of medications d/w patient. Patient is aware that all medications have potential sideeffects and we are unable to predict every sideeffect or drug-drug interaction that may occur.  LE, THAO PHUONG, DO 10/28/2013 11:06 AM

## 2013-11-27 ENCOUNTER — Telehealth: Payer: Self-pay

## 2013-11-27 DIAGNOSIS — R0981 Nasal congestion: Secondary | ICD-10-CM

## 2013-11-27 MED ORDER — FLUTICASONE PROPIONATE 50 MCG/ACT NA SUSP: 2.0000 | Freq: Every day | NASAL | Status: DC

## 2013-11-27 NOTE — Telephone Encounter (Signed)
Pt would like a refill of Flonase called into the [facility] on [address].  [phone]

## 2013-11-27 NOTE — Telephone Encounter (Signed)
Refill sent to the pharmacy 

## 2013-11-29 ENCOUNTER — Encounter: Payer: Self-pay | Admitting: Family Medicine

## 2013-11-29 ENCOUNTER — Ambulatory Visit (INDEPENDENT_AMBULATORY_CARE_PROVIDER_SITE_OTHER): Payer: Federal, State, Local not specified - PPO | Admitting: Family Medicine

## 2013-11-29 VITALS — BP 126/81 | HR 71 | Temp 97.8°F | Resp 16 | Ht 74.25 in | Wt 285.6 lb

## 2013-11-29 DIAGNOSIS — G4700 Insomnia, unspecified: Secondary | ICD-10-CM

## 2013-11-29 DIAGNOSIS — F411 Generalized anxiety disorder: Secondary | ICD-10-CM

## 2013-11-29 DIAGNOSIS — K2100 Gastro-esophageal reflux disease with esophagitis, without bleeding: Secondary | ICD-10-CM

## 2013-11-29 DIAGNOSIS — J3489 Other specified disorders of nose and nasal sinuses: Secondary | ICD-10-CM

## 2013-11-29 DIAGNOSIS — G2581 Restless legs syndrome: Secondary | ICD-10-CM

## 2013-11-29 DIAGNOSIS — F4310 Post-traumatic stress disorder, unspecified: Secondary | ICD-10-CM

## 2013-11-29 DIAGNOSIS — R0981 Nasal congestion: Secondary | ICD-10-CM

## 2013-11-29 DIAGNOSIS — G894 Chronic pain syndrome: Secondary | ICD-10-CM

## 2013-11-29 MED ORDER — FLUTICASONE PROPIONATE 50 MCG/ACT NA SUSP: 2.0000 | Freq: Every day | NASAL | Status: AC

## 2013-11-29 MED ORDER — ROPINIROLE HCL 2 MG PO TABS: 2.0000 mg | ORAL_TABLET | Freq: Two times a day (BID) | ORAL | Status: DC

## 2013-11-29 MED ORDER — DIAZEPAM 10 MG PO TABS: 10.0000 mg | ORAL_TABLET | Freq: Two times a day (BID) | ORAL | Status: DC | PRN

## 2013-11-29 MED ORDER — HYDROCODONE-ACETAMINOPHEN 5-325 MG PO TABS: 1.0000 | ORAL_TABLET | Freq: Three times a day (TID) | ORAL | Status: DC | PRN

## 2013-11-29 MED ORDER — OMEPRAZOLE 20 MG PO CPDR: 20.0000 mg | DELAYED_RELEASE_CAPSULE | Freq: Every day | ORAL | Status: AC

## 2013-11-29 MED ORDER — QUETIAPINE FUMARATE 300 MG PO TABS: ORAL_TABLET | ORAL | Status: DC

## 2013-11-29 MED ORDER — TRAZODONE HCL 50 MG PO TABS: 50.0000 mg | ORAL_TABLET | Freq: Every day | ORAL | Status: DC

## 2013-11-29 NOTE — Progress Notes (Signed)
 Chief Complaint:  Chief Complaint  Patient presents with  . Follow-up    insomnia, bloating, ptsd, anxiety?//  . Leg Swelling    HPI: Antonio Cabrera. is a 58 y.o. male who is here for : 1. Chronic low back pain-he is off methadone, back issues have been the same as before. He is only takinging valiuma nd norco for this.  2. States he has had sleep issues since he has been off of methadone. He is already on Seroquel, already on valium, was on xanax as well , and would like soemthing to help with his insomnia. He states he wants to get some rest. Hetates he has good sleep hygiene: does not nap during day, dark room, quiet room, no computers or phones, he states he cannot maintain more than 2 hrs sleep at one time, does not know waht wakes him up 3. PTSD, anxiety-cont with meds, I tried referring him to neuropsych at [facility] but the Dr there wouldl need a referral from a PCP at their satellite office nad then he would consider admittign new patients, currently he is not doing this at this time from out side the [facility] system.    As of pertinent interest, MR Nicholson [facility] since was on methadone, he weaned himself off and is only on norco now, [facility].  He doe snto want to go to pain management because he feels they may not have his best interest in mind and he doe snot want to be on methadone or stronger pain meds snce this si the first time he has felt like he has been well in 10 years.   Past Medical History  Diagnosis Date  . Arthritis   . Depression   . Allergy   . Hypoglycemia   . GERD (gastroesophageal reflux disease)   . PTSD (post-traumatic stress disorder)   . RLS (restless legs syndrome)   . Neuromuscular disorder    Past Surgical History  Procedure Laterality Date  . Cholecystectomy  2000  . Cervical fusion      C4-C6  . Lumbar fusion      L5-S1  . Nissen  fundoplication    . Ankle arthroscopy    . Knee arthroscopy    . Hernia repair    . Vasectomy    . Spine surgery      Dr Laymond Purser ( decease-neurosurgeon in [place] age 76)    History   Social History  . Marital Status: Married    Spouse Name: N/A    Number of Children: N/A  . Years of Education: 16   Occupational History  . Retired     Retired Recruitment consultant   Social History Main Topics  . Smoking status: Never Smoker   . Smokeless tobacco: Never Used  . Alcohol Use: Yes     Comment: occasional-socially  . Drug Use: No  . Sexual Activity: Yes    Birth Control/ Protection: Surgical   Other Topics Concern  . None   Social History Narrative  . None   Family History  Problem Relation Age of Onset  . Breast cancer Mother   . Cancer Mother   . Stroke Father   . Hypertension Father   . Diabetes Father   . Alzheimer's disease Father   . Alcohol abuse Brother    Allergies  Allergen Reactions  . Antihistamines, Chlorpheniramine-Type Other (See  Comments)    Skin crawling, agitation  . Decongestant [Pseudoephedrine] Other (See Comments)    Skin crawling  . Penicillins Rash   Prior to Admission medications   Medication Sig Start Date End Date Taking? Authorizing Provider  ALPRAZolam Prudy Feeler) 1 MG tablet Take 1 tab PO in the AM , then take 2 tabs PO in the PM. May refill 1 refill 2 weeks from initial pickup. 09/19/13    P , DO  azithromycin (ZITHROMAX) 500 MG tablet Take 1 tablet (500 mg total) by mouth daily. 10/25/13   Sherren Mocha, MD  diazepam (VALIUM) 10 MG tablet Take 1 tablet (10 mg total) by mouth every 12 (twelve) hours as needed for anxiety. May fill on 8/30 10/28/13    P , DO  fluticasone (FLONASE) 50 MCG/ACT nasal spray Place 2 sprays into both nostrils daily. 11/27/13   Chelle S Jeffery, PA-C  Guaifenesin (MUCINEX MAXIMUM STRENGTH) 1200 MG TB12 Take 1 tablet (1,200 mg total) by mouth every 12 (twelve) hours as needed. 10/25/13   Sherren Mocha, MD    HYDROcodone-acetaminophen (NORCO) 5-325 MG per tablet Take 1 tablet by mouth every 8 (eight) hours as needed for moderate pain. May fill on 11/10/13 10/28/13    P , DO  hyoscyamine (LEVSIN, ANASPAZ) 0.125 MG tablet Take 1 tablet (0.125 mg total) by mouth 3 (three) times daily as needed for cramping. 02/05/13   Lorre Munroe, NP  meclizine (ANTIVERT) 25 MG tablet Take 1 tablet (25 mg total) by mouth 3 (three) times daily as needed for dizziness. 10/25/13   Sherren Mocha, MD  promethazine-dextromethorphan (PROMETHAZINE-DM) 6.25-15 MG/5ML syrup Take 5 mLs by mouth 4 (four) times daily as needed for cough. 10/25/13   Sherren Mocha, MD  QUEtiapine (SEROQUEL) 300 MG tablet TAKE 1 TABLET BY MOUTH AT BEDTIME 10/28/13    P , DO  rOPINIRole (REQUIP) 2 MG tablet Take 1 tablet (2 mg total) by mouth 2 (two) times daily. 09/30/13    P , DO     ROS: The patient denies fevers, chills, night sweats, unintentional weight loss, chest pain, palpitations, wheezing, dyspnea on exertion, nausea, vomiting, abdominal pain, dysuria, hematuria, melena, acute  numbness, weakness, or tingling.  All other systems have been reviewed and were otherwise negative with the exception of those mentioned in the HPI and as above.    PHYSICAL EXAM: Filed Vitals:   11/29/13 0929  BP: 126/81  Pulse: 71  Temp: 97.8 F (36.6 C)  Resp: 16   Filed Vitals:   11/29/13 0929  Height: 6' 2.25" (1.886 m)  Weight: 285 lb 9.6 oz (129.547 kg)   Body mass index is 36.42 kg/(m^2).  General: Alert, no acute distress HEENT:  Normocephalic, atraumatic, oropharynx patent. EOMI, PERRLA Cardiovascular:  Regular rate and rhythm, no rubs murmurs or gallops.  No Carotid bruits, radial pulse intact. No pedal edema.  Respiratory: Clear to auscultation bilaterally.  No wheezes, rales, or rhonchi.  No cyanosis, no use of accessory musculature GI: No organomegaly, abdomen is soft and non-tender, positive bowel sounds.  No masses. Skin: No  rashes. Neurologic: Facial musculature symmetric. Psychiatric: Patient is appropriate throughout our interaction. Lymphatic: No cervical lymphadenopathy Musculoskeletal: Gait intact.   LABS: Results for orders placed in visit on 10/25/13  POCT CBC      Result Value Ref Range   WBC 7.5  4.6 - 10.2 K/uL   Lymph, poc 1.2  0.6 - 3.4   POC LYMPH PERCENT 16.2  10 - 50 %L   MID (cbc) 0.4  0 - 0.9   POC MID % 6.0  0 - 12 %M   POC Granulocyte 5.8  2 - 6.9   Granulocyte percent 77.8  37 - 80 %G   RBC 5.00  4.69 - 6.13 M/uL   Hemoglobin 13.2 (*) 14.1 - 18.1 g/dL   HCT, POC 16.1 (*) 09.6 - 53.7 %   MCV 84.2  80 - 97 fL   MCH, POC 26.3 (*) 27 - 31.2 pg   MCHC 31.2 (*) 31.8 - 35.4 g/dL   RDW, POC 04.5     Platelet Count, POC 315  142 - 424 K/uL   MPV 7.0  0 - 99.8 fL  POCT SEDIMENTATION RATE      Result Value Ref Range   POCT SED RATE 26 (*) 0 - 22 mm/hr     EKG/XRAY:   Primary read interpreted by Dr. Conley Rolls at [facility].   ASSESSMENT/PLAN: Encounter Diagnoses  Name Primary?  . Gastroesophageal reflux disease with esophagitis Yes  . Chronic pain syndrome   . Nasal congestion   . Insomnia   . PTSD (post-traumatic stress disorder)   . Restless leg   . Generalized anxiety disorder    Refilled meds with appropriate dates F/u in 1 month Refer to Dr Thedore Mins [phone]); [facility] [address] [place], [place], [place] [address] Will send letter to Malva Limes Burr's office ( done) Fax # [phone] Phone # [phone]   Gross sideeffects, risk and benefits, and alternatives of medications d/w patient. Patient is aware that all medications have potential sideeffects and we are unable to predict every sideeffect or drug-drug interaction that may occur.  Hamilton Capri PHUONG, DO 11/29/2013 12:33 PM

## 2013-12-03 ENCOUNTER — Telehealth: Payer: Self-pay

## 2013-12-03 NOTE — Telephone Encounter (Signed)
Pt coming into be reevaluated tomorrow morning. He would like to see Dr. Conley Rolls and is unable to come in today.

## 2013-12-03 NOTE — Telephone Encounter (Signed)
Le - Pt says his throat is killing him and swollen badly.  He wants an antibiotic.  I told him he should come in, but he says his eyes are burning tos bad that he cant see.[phone]

## 2013-12-04 ENCOUNTER — Ambulatory Visit (INDEPENDENT_AMBULATORY_CARE_PROVIDER_SITE_OTHER): Payer: Federal, State, Local not specified - PPO

## 2013-12-04 ENCOUNTER — Ambulatory Visit (INDEPENDENT_AMBULATORY_CARE_PROVIDER_SITE_OTHER): Payer: Federal, State, Local not specified - PPO | Admitting: Family Medicine

## 2013-12-04 VITALS — BP 100/68 | HR 107 | Temp 98.1°F | Resp 18 | Wt 284.0 lb

## 2013-12-04 DIAGNOSIS — R509 Fever, unspecified: Secondary | ICD-10-CM

## 2013-12-04 DIAGNOSIS — J0120 Acute ethmoidal sinusitis, unspecified: Secondary | ICD-10-CM

## 2013-12-04 DIAGNOSIS — R059 Cough, unspecified: Secondary | ICD-10-CM

## 2013-12-04 DIAGNOSIS — R05 Cough: Secondary | ICD-10-CM

## 2013-12-04 DIAGNOSIS — R0602 Shortness of breath: Secondary | ICD-10-CM

## 2013-12-04 DIAGNOSIS — Z125 Encounter for screening for malignant neoplasm of prostate: Secondary | ICD-10-CM

## 2013-12-04 DIAGNOSIS — J209 Acute bronchitis, unspecified: Secondary | ICD-10-CM

## 2013-12-04 DIAGNOSIS — J069 Acute upper respiratory infection, unspecified: Secondary | ICD-10-CM

## 2013-12-04 DIAGNOSIS — R350 Frequency of micturition: Secondary | ICD-10-CM

## 2013-12-04 LAB — POCT CBC
Granulocyte percent: 77 %G (ref 37–80)
HCT, POC: 44.4 % (ref 43.5–53.7)
Hemoglobin: 13.8 g/dL — AB (ref 14.1–18.1)
Lymph, poc: 1 (ref 0.6–3.4)
MCH, POC: 25.6 pg — AB (ref 27–31.2)
MCHC: 31.2 g/dL — AB (ref 31.8–35.4)
MCV: 81.9 fL (ref 80–97)
MID (cbc): 0.8 (ref 0–0.9)
MPV: 7.1 fL (ref 0–99.8)
POC Granulocyte: 5.8 (ref 2–6.9)
POC LYMPH PERCENT: 12.9 %L (ref 10–50)
POC MID %: 10.1 %M (ref 0–12)
Platelet Count, POC: 264 10*3/uL (ref 142–424)
RBC: 5.42 M/uL (ref 4.69–6.13)
RDW, POC: 16.3 %
WBC: 7.5 10*3/uL (ref 4.6–10.2)

## 2013-12-04 LAB — POCT UA - MICROSCOPIC ONLY
Bacteria, U Microscopic: NEGATIVE
Casts, Ur, LPF, POC: NEGATIVE
Crystals, Ur, HPF, POC: NEGATIVE
Mucus, UA: NEGATIVE
Yeast, UA: NEGATIVE

## 2013-12-04 LAB — POCT URINALYSIS DIPSTICK
Blood, UA: NEGATIVE
Glucose, UA: NEGATIVE
Ketones, UA: NEGATIVE
Leukocytes, UA: NEGATIVE
Nitrite, UA: NEGATIVE
Protein, UA: NEGATIVE
Spec Grav, UA: 1.02
Urobilinogen, UA: 0.2
pH, UA: 5.5

## 2013-12-04 LAB — GLUCOSE, POCT (MANUAL RESULT ENTRY): POC Glucose: 148 mg/dl — AB (ref 70–99)

## 2013-12-04 LAB — POCT INFLUENZA A/B
Influenza A, POC: NEGATIVE
Influenza B, POC: NEGATIVE

## 2013-12-04 MED ORDER — HYDROCODONE-HOMATROPINE 5-1.5 MG/5ML PO SYRP: 5.0000 mL | ORAL_SOLUTION | Freq: Three times a day (TID) | ORAL | Status: DC | PRN

## 2013-12-04 MED ORDER — ALBUTEROL SULFATE HFA 108 (90 BASE) MCG/ACT IN AERS: 2.0000 | INHALATION_SPRAY | Freq: Four times a day (QID) | RESPIRATORY_TRACT | Status: DC | PRN

## 2013-12-04 MED ORDER — BENZONATATE 100 MG PO CAPS: 200.0000 mg | ORAL_CAPSULE | Freq: Two times a day (BID) | ORAL | Status: DC | PRN

## 2013-12-04 MED ORDER — AZITHROMYCIN 250 MG PO TABS: ORAL_TABLET | ORAL | Status: DC

## 2013-12-04 NOTE — Patient Instructions (Signed)
Acute Bronchitis Bronchitis is inflammation of the airways that extend from the windpipe into the lungs (bronchi). The inflammation often causes mucus to develop. This leads to a cough, which is the most common symptom of bronchitis.  In acute bronchitis, the condition usually develops suddenly and goes away over time, usually in a couple weeks. Smoking, allergies, and asthma can make bronchitis worse. Repeated episodes of bronchitis may cause further lung problems.  CAUSES Acute bronchitis is most often caused by the same virus that causes a cold. The virus can spread from person to person (contagious) through coughing, sneezing, and touching contaminated objects. SIGNS AND SYMPTOMS   Cough.   Fever.   Coughing up mucus.   Body aches.   Chest congestion.   Chills.   Shortness of breath.   Sore throat.  DIAGNOSIS  Acute bronchitis is usually diagnosed through a physical exam. Your health care provider will also ask you questions about your medical history. Tests, such as chest X-rays, are sometimes done to rule out other conditions.  TREATMENT  Acute bronchitis usually goes away in a couple weeks. Oftentimes, no medical treatment is necessary. Medicines are sometimes given for relief of fever or cough. Antibiotic medicines are usually not needed but may be prescribed in certain situations. In some cases, an inhaler may be recommended to help reduce shortness of breath and control the cough. A cool mist vaporizer may also be used to help thin bronchial secretions and make it easier to clear the chest.  HOME CARE INSTRUCTIONS  Get plenty of rest.   Drink enough fluids to keep your urine clear or pale yellow (unless you have a medical condition that requires fluid restriction). Increasing fluids may help thin your respiratory secretions (sputum) and reduce chest congestion, and it will prevent dehydration.   Take medicines only as directed by your health care provider.  If  you were prescribed an antibiotic medicine, finish it all even if you start to feel better.  Avoid smoking and secondhand smoke. Exposure to cigarette smoke or irritating chemicals will make bronchitis worse. If you are a smoker, consider using nicotine gum or skin patches to help control withdrawal symptoms. Quitting smoking will help your lungs heal faster.   Reduce the chances of another bout of acute bronchitis by washing your hands frequently, avoiding people with cold symptoms, and trying not to touch your hands to your mouth, nose, or eyes.   Keep all follow-up visits as directed by your health care provider.  SEEK MEDICAL CARE IF: Your symptoms do not improve after 1 week of treatment.  SEEK IMMEDIATE MEDICAL CARE IF:  You develop an increased fever or chills.   You have chest pain.   You have severe shortness of breath.  You have bloody sputum.   You develop dehydration.  You faint or repeatedly feel like you are going to pass out.  You develop repeated vomiting.  You develop a severe headache. MAKE SURE YOU:   Understand these instructions.  Will watch your condition.  Will get help right away if you are not doing well or get worse. Document Released: 04/07/2004 Document Revised: 07/15/2013 Document Reviewed: 08/21/2012 ExitCare Patient Information 2015 [employer]. This information is not intended to replace advice given to you by your health care provider. Make sure you discuss any questions you have with your health care provider. Sinusitis Sinusitis is redness, soreness, and inflammation of the paranasal sinuses. Paranasal sinuses are air pockets within the bones of your face (beneath the   eyes, the middle of the forehead, or above the eyes). In healthy paranasal sinuses, mucus is able to drain out, and air is able to circulate through them by way of your nose. However, when your paranasal sinuses are inflamed, mucus and air can become trapped. This can  allow bacteria and other germs to grow and cause infection. Sinusitis can develop quickly and last only a short time (acute) or continue over a long period (chronic). Sinusitis that lasts for more than 12 weeks is considered chronic.  CAUSES  Causes of sinusitis include:  Allergies.  Structural abnormalities, such as displacement of the cartilage that separates your nostrils (deviated septum), which can decrease the air flow through your nose and sinuses and affect sinus drainage.  Functional abnormalities, such as when the small hairs (cilia) that line your sinuses and help remove mucus do not work properly or are not present. SIGNS AND SYMPTOMS  Symptoms of acute and chronic sinusitis are the same. The primary symptoms are pain and pressure around the affected sinuses. Other symptoms include:  Upper toothache.  Earache.  Headache.  Bad breath.  Decreased sense of smell and taste.  A cough, which worsens when you are lying flat.  Fatigue.  Fever.  Thick drainage from your nose, which often is green and may contain pus (purulent).  Swelling and warmth over the affected sinuses. DIAGNOSIS  Your health care provider will perform a physical exam. During the exam, your health care provider may:  Look in your nose for signs of abnormal growths in your nostrils (nasal polyps).  Tap over the affected sinus to check for signs of infection.  View the inside of your sinuses (endoscopy) using an imaging device that has a light attached (endoscope). If your health care provider suspects that you have chronic sinusitis, one or more of the following tests may be recommended:  Allergy tests.  Nasal culture. A sample of mucus is taken from your nose, sent to a lab, and screened for bacteria.  Nasal cytology. A sample of mucus is taken from your nose and examined by your health care provider to determine if your sinusitis is related to an allergy. TREATMENT  Most cases of acute  sinusitis are related to a viral infection and will resolve on their own within 10 days. Sometimes medicines are prescribed to help relieve symptoms (pain medicine, decongestants, nasal steroid sprays, or saline sprays).  However, for sinusitis related to a bacterial infection, your health care provider will prescribe antibiotic medicines. These are medicines that will help kill the bacteria causing the infection.  Rarely, sinusitis is caused by a fungal infection. In theses cases, your health care provider will prescribe antifungal medicine. For some cases of chronic sinusitis, surgery is needed. Generally, these are cases in which sinusitis recurs more than 3 times per year, despite other treatments. HOME CARE INSTRUCTIONS   Drink plenty of water. Water helps thin the mucus so your sinuses can drain more easily.  Use a humidifier.  Inhale steam 3 to 4 times a day (for example, sit in the bathroom with the shower running).  Apply a warm, moist washcloth to your face 3 to 4 times a day, or as directed by your health care provider.  Use saline nasal sprays to help moisten and clean your sinuses.  Take medicines only as directed by your health care provider.  If you were prescribed either an antibiotic or antifungal medicine, finish it all even if you start to feel better. SEEK IMMEDIATE MEDICAL   CARE IF:  You have increasing pain or severe headaches.  You have nausea, vomiting, or drowsiness.  You have swelling around your face.  You have vision problems.  You have a stiff neck.  You have difficulty breathing. MAKE SURE YOU:   Understand these instructions.  Will watch your condition.  Will get help right away if you are not doing well or get worse. Document Released: 02/28/2005 Document Revised: 07/15/2013 Document Reviewed: 03/15/2011 ExitCare Patient Information 2015 [employer]. This information is not intended to replace advice given to you by your health care provider.  Make sure you discuss any questions you have with your health care provider.  

## 2013-12-04 NOTE — Progress Notes (Signed)
Chief Complaint:  Chief Complaint  Patient presents with  . Fever    103.7 x3days   . Cough  . Shortness of Breath    HPI: Antonio Cabrera. is a 58 y.o. male who is here for  3 day history of fevers and chills and sinus congestion on his maxillary and frontal sinuses. He is congested. He has had a fever of up to 103.7, cannot take antihistamines or NSAIDs since upsets his stomach, he has had a hiatal hernia s/p Nissen. He ahs been taking tylenol.  He has sinus issues and has been taking flonase. He ahs a cough, nonproductive, if he does produce anything then has white and yellow sputum. He has minimal right ankle swelling and also icnrease frequency in urination.  He also has had increase frequency of urination, eventhough drinking consumption has been the same. He is  He has no h/o  BPH , PSA last year was normal   Past Medical History  Diagnosis Date  . Arthritis   . Depression   . Allergy   . Hypoglycemia   . GERD (gastroesophageal reflux disease)   . PTSD (post-traumatic stress disorder)   . RLS (restless legs syndrome)   . Neuromuscular disorder    Past Surgical History  Procedure Laterality Date  . Cholecystectomy  2000  . Cervical fusion      C4-C6  . Lumbar fusion      L5-S1  . Nissen fundoplication    . Ankle arthroscopy    . Knee arthroscopy    . Hernia repair    . Vasectomy    . Spine surgery      Dr Laymond Purser ( decease-neurosurgeon in [place] age 23)    History   Social History  . Marital Status: Married    Spouse Name: N/A    Number of Children: N/A  . Years of Education: 16   Occupational History  . Retired     Retired Recruitment consultant   Social History Main Topics  . Smoking status: Never Smoker   . Smokeless tobacco: Never Used  . Alcohol Use: Yes     Comment: occasional-socially  . Drug Use: No  . Sexual Activity: Yes    Birth Control/ Protection: Surgical   Other Topics Concern  . None   Social History Narrative  . None    Family History  Problem Relation Age of Onset  . Breast cancer Mother   . Cancer Mother   . Stroke Father   . Hypertension Father   . Diabetes Father   . Alzheimer's disease Father   . Alcohol abuse Brother    Allergies  Allergen Reactions  . Antihistamines, Chlorpheniramine-Type Other (See Comments)    Skin crawling, agitation  . Decongestant [Pseudoephedrine] Other (See Comments)    Skin crawling  . Penicillins Rash   Prior to Admission medications   Medication Sig Start Date End Date Taking? Authorizing Provider  ALPRAZolam Prudy Feeler) 1 MG tablet Take 1 tab PO in the AM , then take 2 tabs PO in the PM. May refill 1 refill 2 weeks from initial pickup. 09/19/13  Yes Thao P Le, DO  diazepam (VALIUM) 10 MG tablet Take 1 tablet (10 mg total) by mouth every 12 (twelve) hours as needed for anxiety. May fill on 9/30 11/29/13  Yes Thao P Le, DO  fluticasone (FLONASE) 50 MCG/ACT nasal spray Place 2 sprays into both nostrils daily. 11/29/13  Yes Thao P Le, DO  HYDROcodone-acetaminophen (NORCO) 5-325 MG per tablet Take 1 tablet by mouth every 8 (eight) hours as needed for moderate pain. May fill on 12/11/13 11/29/13  Yes Thao P Le, DO  hyoscyamine (LEVSIN, ANASPAZ) 0.125 MG tablet Take 1 tablet (0.125 mg total) by mouth 3 (three) times daily as needed for cramping. 02/05/13  Yes Lorre Munroe, NP  omeprazole (PRILOSEC) 20 MG capsule Take 1 capsule (20 mg total) by mouth daily. 11/29/13  Yes Thao P Le, DO  QUEtiapine (SEROQUEL) 300 MG tablet TAKE 1 TABLET BY MOUTH AT BEDTIME 11/29/13  Yes Thao P Le, DO  rOPINIRole (REQUIP) 2 MG tablet Take 1 tablet (2 mg total) by mouth 2 (two) times daily. 11/29/13  Yes Thao P Le, DO  traZODone (DESYREL) 50 MG tablet Take 1 tablet (50 mg total) by mouth at bedtime. 11/29/13  Yes Thao P Le, DO  Guaifenesin (MUCINEX MAXIMUM STRENGTH) 1200 MG TB12 Take 1 tablet (1,200 mg total) by mouth every 12 (twelve) hours as needed. 10/25/13   Sherren Mocha, MD     ROS: The patient  denies  unintentional weight loss, chest pain, palpitations, wheezing, dyspnea on exertion, nausea, vomiting, abdominal pain, dysuria, hematuria, melena, numbness, weakness, or tingling.   All other systems have been reviewed and were otherwise negative with the exception of those mentioned in the HPI and as above.    PHYSICAL EXAM: Filed Vitals:   12/04/13 0933  BP: 100/68  Pulse: 107  Temp: 98.1 F (36.7 C)  Resp: 18   Filed Vitals:   12/04/13 0933  Weight: 284 lb (128.822 kg)   Body mass index is 36.22 kg/(m^2).  General: Alert, no acute distress HEENT:  Normocephalic, atraumatic, oropharynx patent. EOMI, PERRLA, + sinus tenderness, TM normal, no exudates Cardiovascular:  Regular rate and rhythm, no rubs murmurs or gallops.  No Carotid bruits, radial pulse intact. Trace pedal edema.  Right ankle looks like it ahs always been.  Respiratory: Clear to auscultation bilaterally.  No wheezes, rales, or rhonchi.  No cyanosis, no use of accessory musculature GI: No organomegaly, abdomen is soft and non-tender, positive bowel sounds.  No masses. Skin: No rashes. Neurologic: Facial musculature symmetric. Psychiatric: Patient is appropriate throughout our interaction. Lymphatic: No cervical lymphadenopathy Musculoskeletal: Gait intact. No LAD Prostate is normal   LABS: Results for orders placed in visit on 12/04/13  POCT CBC      Result Value Ref Range   WBC 7.5  4.6 - 10.2 K/uL   Lymph, poc 1.0  0.6 - 3.4   POC LYMPH PERCENT 12.9  10 - 50 %L   MID (cbc) 0.8  0 - 0.9   POC MID % 10.1  0 - 12 %M   POC Granulocyte 5.8  2 - 6.9   Granulocyte percent 77.0  37 - 80 %G   RBC 5.42  4.69 - 6.13 M/uL   Hemoglobin 13.8 (*) 14.1 - 18.1 g/dL   HCT, POC 16.1  09.6 - 53.7 %   MCV 81.9  80 - 97 fL   MCH, POC 25.6 (*) 27 - 31.2 pg   MCHC 31.2 (*) 31.8 - 35.4 g/dL   RDW, POC 04.5     Platelet Count, POC 264  142 - 424 K/uL   MPV 7.1  0 - 99.8 fL  POCT INFLUENZA A/B      Result Value Ref  Range   Influenza A, POC Negative     Influenza B, POC Negative    POCT  URINALYSIS DIPSTICK      Result Value Ref Range   Color, UA yellow     Clarity, UA clear     Glucose, UA neg     Bilirubin, UA ne     Ketones, UA neg     Spec Grav, UA 1.020     Blood, UA neg     pH, UA 5.5     Protein, UA neg     Urobilinogen, UA 0.2     Nitrite, UA neg     Leukocytes, UA Negative    POCT UA - MICROSCOPIC ONLY      Result Value Ref Range   WBC, Ur, HPF, POC 0-1     RBC, urine, microscopic 0-1     Bacteria, U Microscopic neg     Mucus, UA neg     Epithelial cells, urine per micros 1-2     Crystals, Ur, HPF, POC neg     Casts, Ur, LPF, POC neg     Yeast, UA neg     Renal tubular cells         EKG/XRAY:   Primary read interpreted by Dr. Conley Rolls at [facility] Bronchitic changes No infiltrates or effusion   ASSESSMENT/PLAN: Encounter Diagnoses  Name Primary?  . Cough   . Increased urinary frequency   . Screening for prostate cancer   . Fever, unspecified   . Acute ethmoidal sinusitis, recurrence not specified Yes  . Acute upper respiratory infections of unspecified site   . Acute bronchitis, unspecified organism   . SOB (shortness of breath)    Rx azithromycin Rx Hycodan and tessalon perles Rx Albuterol with warnings C/w flonase, consider atrovent NS if sxs do not improve Advise to be minful of taking all his meds together sicne many of them are sedating F/u prn  Gross sideeffects, risk and benefits, and alternatives of medications d/w patient. Patient is aware that all medications have potential sideeffects and we are unable to predict every sideeffect or drug-drug interaction that may occur.  LE, THAO PHUONG, DO 12/04/2013 11:04 AM

## 2013-12-05 ENCOUNTER — Encounter: Payer: Self-pay | Admitting: Family Medicine

## 2013-12-05 LAB — PSA: PSA: 2.55 ng/mL (ref <=4.00)

## 2013-12-07 ENCOUNTER — Telehealth: Payer: Self-pay

## 2013-12-07 MED ORDER — ROPINIROLE HCL 2 MG PO TABS: 2.0000 mg | ORAL_TABLET | Freq: Every day | ORAL | Status: AC

## 2013-12-07 NOTE — Telephone Encounter (Signed)
This was sent in on 9/18 called patient. I called patient he indicates he needs 3 day supply sent in for him. This  Is called in for him

## 2013-12-07 NOTE — Telephone Encounter (Signed)
Please return call. Patient really needs his medication.

## 2013-12-07 NOTE — Telephone Encounter (Signed)
Dr Conley Rolls.   Patient states he needs his rOPINIRole (REQUIP) 2 MG tablet Within 30 minutes.  His legs are jumping all over.    [facility] on [address] [place].   Pharmacy phone [phone]

## 2013-12-10 ENCOUNTER — Encounter (HOSPITAL_COMMUNITY): Payer: Self-pay | Admitting: Emergency Medicine

## 2013-12-10 ENCOUNTER — Emergency Department (HOSPITAL_COMMUNITY)
Admission: EM | Admit: 2013-12-10 | Discharge: 2013-12-11 | Disposition: A | Discharge status: Home / Self Care | Payer: Federal, State, Local not specified - PPO | Attending: Emergency Medicine | Admitting: Emergency Medicine

## 2013-12-10 DIAGNOSIS — F329 Major depressive disorder, single episode, unspecified: Secondary | ICD-10-CM | POA: Insufficient documentation

## 2013-12-10 DIAGNOSIS — Z880 Allergy status to penicillin: Secondary | ICD-10-CM | POA: Diagnosis not present

## 2013-12-10 DIAGNOSIS — IMO0002 Reserved for concepts with insufficient information to code with codable children: Secondary | ICD-10-CM | POA: Diagnosis not present

## 2013-12-10 DIAGNOSIS — F4310 Post-traumatic stress disorder, unspecified: Secondary | ICD-10-CM | POA: Insufficient documentation

## 2013-12-10 DIAGNOSIS — Z792 Long term (current) use of antibiotics: Secondary | ICD-10-CM | POA: Insufficient documentation

## 2013-12-10 DIAGNOSIS — K029 Dental caries, unspecified: Secondary | ICD-10-CM | POA: Insufficient documentation

## 2013-12-10 DIAGNOSIS — K219 Gastro-esophageal reflux disease without esophagitis: Secondary | ICD-10-CM | POA: Diagnosis not present

## 2013-12-10 DIAGNOSIS — K089 Disorder of teeth and supporting structures, unspecified: Secondary | ICD-10-CM | POA: Insufficient documentation

## 2013-12-10 DIAGNOSIS — Z8669 Personal history of other diseases of the nervous system and sense organs: Secondary | ICD-10-CM | POA: Diagnosis not present

## 2013-12-10 DIAGNOSIS — M2763 Post-osseointegration mechanical failure of dental implant: Secondary | ICD-10-CM | POA: Diagnosis not present

## 2013-12-10 DIAGNOSIS — G2581 Restless legs syndrome: Secondary | ICD-10-CM | POA: Diagnosis not present

## 2013-12-10 DIAGNOSIS — Z8739 Personal history of other diseases of the musculoskeletal system and connective tissue: Secondary | ICD-10-CM | POA: Diagnosis not present

## 2013-12-10 DIAGNOSIS — K002 Abnormalities of size and form of teeth: Secondary | ICD-10-CM | POA: Diagnosis not present

## 2013-12-10 DIAGNOSIS — Z79899 Other long term (current) drug therapy: Secondary | ICD-10-CM | POA: Diagnosis not present

## 2013-12-10 DIAGNOSIS — F3289 Other specified depressive episodes: Secondary | ICD-10-CM | POA: Diagnosis not present

## 2013-12-10 NOTE — ED Notes (Signed)
Pt. broke his lower denture while chewing a piece of candy this evening , reports pain at lower gum / no bleeding.

## 2013-12-10 NOTE — ED Provider Notes (Signed)
CSN: [id number]     Arrival date & time 12/10/13  2219 History  This chart was scribed for non-physician practitioner, Dierdre Forth, PA-C working with Purvis Sheffield, MD by Luisa Dago, ED scribe. This patient was seen in room TR04C/TR04C and the patient's care was started at 11:15 PM.     Chief Complaint  Patient presents with  . Dental Problem   The history is provided by the patient and medical records. No language interpreter was used.   HPI Comments: Antonio Cabrera. is a 58 y.o. male who presents to the Emergency Department complaining of a dental pain that started just PTA. Pt states that he had dental surgery done in May in a temporary partial was implanted in the lower jaw with 4 screws. He states that he was eating tonight when the right partn of the implanted teeth broke off. Pt states that currently the sharp part is "cutting his lower gum." He denies any fever, chills, nausea, or emesis.  Past Medical History  Diagnosis Date  . Arthritis   . Depression   . Allergy   . Hypoglycemia   . GERD (gastroesophageal reflux disease)   . PTSD (post-traumatic stress disorder)   . RLS (restless legs syndrome)   . Neuromuscular disorder    Past Surgical History  Procedure Laterality Date  . Cholecystectomy  2000  . Cervical fusion      C4-C6  . Lumbar fusion      L5-S1  . Nissen fundoplication    . Ankle arthroscopy    . Knee arthroscopy    . Hernia repair    . Vasectomy    . Spine surgery      Dr Laymond Purser ( decease-neurosurgeon in [place] age 37)    Family History  Problem Relation Age of Onset  . Breast cancer Mother   . Cancer Mother   . Stroke Father   . Hypertension Father   . Diabetes Father   . Alzheimer's disease Father   . Alcohol abuse Brother    History  Substance Use Topics  . Smoking status: Never Smoker   . Smokeless tobacco: Never Used  . Alcohol Use: Yes     Comment: occasional-socially    Review of Systems  Constitutional:  Negative for fever.  HENT: Positive for dental problem. Negative for facial swelling and sore throat.   Respiratory: Negative for shortness of breath.   Musculoskeletal: Negative for neck pain and neck stiffness.   Allergies  Antihistamines, chlorpheniramine-type; Decongestant; and Penicillins  Home Medications   Prior to Admission medications   Medication Sig Start Date End Date Taking? Authorizing Provider  albuterol (PROVENTIL HFA;VENTOLIN HFA) 108 (90 BASE) MCG/ACT inhaler Inhale 2 puffs into the lungs every 6 (six) hours as needed for wheezing or shortness of breath. May cause jitteryness 12/04/13  Yes Thao P Le, DO  ALPRAZolam (XANAX) 1 MG tablet Take 1-2 mg by mouth 2 (two) times daily. Take 1 tablet every morning and take 2 tablets every evening   Yes Historical Provider, MD  benzonatate (TESSALON) 100 MG capsule Take 2 capsules (200 mg total) by mouth 2 (two) times daily as needed. 12/04/13  Yes Thao P Le, DO  diazepam (VALIUM) 10 MG tablet Take 1 tablet (10 mg total) by mouth every 12 (twelve) hours as needed for anxiety. May fill on 9/30 11/29/13  Yes Thao P Le, DO  fluticasone (FLONASE) 50 MCG/ACT nasal spray Place 2 sprays into both nostrils daily. 11/29/13  Yes Thao P  Le, DO  Guaifenesin (MUCINEX MAXIMUM STRENGTH) 1200 MG TB12 Take 1 tablet (1,200 mg total) by mouth every 12 (twelve) hours as needed. 10/25/13  Yes Sherren Mocha, MD  HYDROcodone-acetaminophen (NORCO) 5-325 MG per tablet Take 1 tablet by mouth every 8 (eight) hours as needed for moderate pain. May fill on 12/11/13 11/29/13  Yes Thao P Le, DO  HYDROcodone-homatropine (HYCODAN) 5-1.5 MG/5ML syrup Take 5 mLs by mouth every 8 (eight) hours as needed for cough. 12/04/13  Yes Thao P Le, DO  hyoscyamine (LEVSIN, ANASPAZ) 0.125 MG tablet Take 1 tablet (0.125 mg total) by mouth 3 (three) times daily as needed for cramping. 02/05/13  Yes Lorre Munroe, NP  omeprazole (PRILOSEC) 20 MG capsule Take 1 capsule (20 mg total) by mouth daily.  11/29/13  Yes Thao P Le, DO  QUEtiapine (SEROQUEL) 300 MG tablet Take 300 mg by mouth at bedtime.   Yes Historical Provider, MD  rOPINIRole (REQUIP) 2 MG tablet Take 1 tablet (2 mg total) by mouth at bedtime. 12/07/13  Yes Thao P Le, DO  traZODone (DESYREL) 50 MG tablet Take 1 tablet (50 mg total) by mouth at bedtime. 11/29/13  Yes Thao P Le, DO  azithromycin (ZITHROMAX) 250 MG tablet Take 2 tabs po now then 1 tab po daily for the next 4 days 12/04/13   Thao P Le, DO   Triage Vitals: BP 121/96  Pulse 88  Temp(Src) 98.2 F (36.8 C) (Oral)  Resp 14  SpO2 97%  Physical Exam  Nursing note and vitals reviewed. Constitutional: He appears well-developed and well-nourished.  HENT:  Head: Normocephalic.  Right Ear: Tympanic membrane, external ear and ear canal normal.  Left Ear: Tympanic membrane, external ear and ear canal normal.  Nose: Nose normal. Right sinus exhibits no maxillary sinus tenderness and no frontal sinus tenderness. Left sinus exhibits no maxillary sinus tenderness and no frontal sinus tenderness.  Mouth/Throat: Uvula is midline, oropharynx is clear and moist and mucous membranes are normal. No oral lesions. Abnormal dentition. Dental caries present. No uvula swelling or lacerations. No oropharyngeal exudate, posterior oropharyngeal edema, posterior oropharyngeal erythema or tonsillar abscesses.  Pt has a temporary partial implant which is adhered to the lower jaw with four screws and is broken of the right side. There is no laceration to the oral mucosa or gingiva.  No swelling of the face.  Eyes: Conjunctivae are normal. Pupils are equal, round, and reactive to light. Right eye exhibits no discharge. Left eye exhibits no discharge.  Neck: Normal range of motion. Neck supple.  Cardiovascular: Normal rate, regular rhythm, normal heart sounds and intact distal pulses.   Pulmonary/Chest: Effort normal and breath sounds normal. No respiratory distress. He has no wheezes.  Abdominal:  Soft. Bowel sounds are normal. He exhibits no distension. There is no tenderness.  Lymphadenopathy:    He has no cervical adenopathy.  Neurological: He is alert.  Skin: Skin is warm and dry.  Psychiatric: He has a normal mood and affect.    ED Course  Dental Date/Time: 12/11/2013 12:34 AM Performed by: Dierdre Forth Authorized by: Dierdre Forth Consent: Verbal consent obtained. Risks and benefits: risks, benefits and alternatives were discussed Consent given by: patient Patient understanding: patient states understanding of the procedure being performed Patient consent: the patient's understanding of the procedure matches consent given Procedure consent: procedure consent matches procedure scheduled Relevant documents: relevant documents present and verified Site marked: the operative site was marked Required items: required blood products, implants, devices, and  special equipment available Patient identity confirmed: verbally with patient and arm band Time out: Immediately prior to procedure a "time out" was called to verify the correct patient, procedure, equipment, support staff and site/side marked as required. Local anesthesia used: no Patient sedated: no Patient tolerance: Patient tolerated the procedure well with no immediate complications. Comments: Co-Pak used to cover the sharp portion of the dental implant.   (including critical care time)  DIAGNOSTIC STUDIES: Oxygen Saturation is 97% on RA, adequate by my interpretation.    COORDINATION OF CARE: 11:19 PM- pt is requesting that an orthodontist be called in to see him. Told pt that is very unlikely seeing that they only come in if its a trauma, and his visit does not qualify as such. Pt is insistent. Will discuss with attending. Pt advised of plan for treatment and pt agrees.  Labs Review Labs Reviewed - No data to display  Imaging Review No results found.   EKG Interpretation None      MDM    Final diagnoses:  Fractured dental implant   Rennis HardingJames Cali Jr. presents with dental implant broken.  Discussed with Dr. Romeo AppleHarrison and Dr. Ranae PalmsYelverton who recommend use of Co-Pak to buffer the sharp edge.    Co-Pak applied without complication.  Pt requests peridontist to fix the partial.   12:26 AM Dr. Russella DarBenitez paged for consult.    1:01 AM Pt has been paged X2 without call back.    1:42 AM No return call from Dr. Russella DarBenitez after 3 pages.  Pt would like to wait.  Case discussed with Junious SilkHannah Merrell, PA-C who will discuss the patient with the dentist when he returns the call.  Co-Pak remains in place.    BP 121/96  Pulse 88  Temp(Src) 98.2 F (36.8 C) (Oral)  Resp 14  SpO2 97%  I personally performed the services described in this documentation, which was scribed in my presence. The recorded information has been reviewed and is accurate.    Dahlia ClientHannah Saron Vanorman, PA-C 12/11/13 [phone]

## 2013-12-11 NOTE — ED Provider Notes (Signed)
Medical screening examination/treatment/procedure(s) were performed by non-physician practitioner and as supervising physician I was immediately available for consultation/collaboration.   EKG Interpretation None        Jolonda Gomm, MD 12/11/13 0548 

## 2013-12-11 NOTE — ED Provider Notes (Signed)
Medical screening examination/treatment/procedure(s) were performed by non-physician practitioner and as supervising physician I was immediately available for consultation/collaboration.   EKG Interpretation None        Purvis SheffieldForrest Ivory Bail, MD 12/11/13 [phone]

## 2013-12-11 NOTE — ED Provider Notes (Signed)
Patient signed out to me by Muthersbaugh, PA-C at change of shift. Patient with broken dental implant. Co-Pak applied in ED without complication. Patient is requesting peridontist to fix the partial. Dr. Russella DarBenitez, dentist on call has been paged x 3 without returning phone call. Discussed this with Diplomatic Services operational officersecretary. She will page Dr. Russella DarBenitez every 20 minutes until she hears that he has called back. Patient aware of delay and possibility of waiting hours.   Patient at 2:52 decides he wants to leave ED. Still no call from dentist.   Mora BellmanHannah S Savannha Welle, PA-C 12/11/13 [phone]

## 2013-12-11 NOTE — Discharge Instructions (Signed)
1. Medications: usual home medications 2. Treatment: rest, drink plenty of fluids,  3. Follow Up: Please followup with your dentist or Dr. Russella DarBenitez tomorrow

## 2013-12-12 ENCOUNTER — Telehealth: Payer: Self-pay

## 2013-12-12 NOTE — Telephone Encounter (Signed)
Pt wants to let Dr. Conley RollsLe know that he is seeing another doctor while he is out of town for some severe abdominal discomfort. He would like for Dr. Conley RollsLe to give him a call back.

## 2013-12-14 NOTE — Telephone Encounter (Signed)
LM to call us back, no answer

## 2013-12-25 ENCOUNTER — Other Ambulatory Visit: Payer: Self-pay | Admitting: Family Medicine

## 2013-12-26 ENCOUNTER — Telehealth: Payer: Self-pay | Admitting: *Deleted

## 2013-12-26 NOTE — Telephone Encounter (Signed)
Patient phoned to cancel appointment with Dr. Conley RollsLe for tomorrow 10/16 [place].  Needed to reschedule for Tuesday or Wednesday of next week-rescheduled for 10/21 with Dr. Nilda SimmerKristi Smith.  Further stated that this should be a hospital d/c f/u visit & asked if [facility] had sent his records.  Stated he was also going to be needing a med refill.  Could not wait until Dr. Conley RollsLe had an opening b/c [place].

## 2013-12-27 ENCOUNTER — Ambulatory Visit: Payer: Federal, State, Local not specified - PPO | Admitting: Family Medicine

## 2013-12-30 ENCOUNTER — Ambulatory Visit (INDEPENDENT_AMBULATORY_CARE_PROVIDER_SITE_OTHER): Payer: Federal, State, Local not specified - PPO | Admitting: Family Medicine

## 2013-12-30 ENCOUNTER — Ambulatory Visit (INDEPENDENT_AMBULATORY_CARE_PROVIDER_SITE_OTHER): Payer: Federal, State, Local not specified - PPO

## 2013-12-30 VITALS — BP 128/82 | HR 94 | Temp 97.8°F | Resp 18 | Ht 74.0 in | Wt 285.2 lb

## 2013-12-30 DIAGNOSIS — K567 Ileus, unspecified: Secondary | ICD-10-CM

## 2013-12-30 DIAGNOSIS — R600 Localized edema: Secondary | ICD-10-CM

## 2013-12-30 DIAGNOSIS — I5189 Other ill-defined heart diseases: Secondary | ICD-10-CM

## 2013-12-30 DIAGNOSIS — I519 Heart disease, unspecified: Secondary | ICD-10-CM

## 2013-12-30 DIAGNOSIS — K449 Diaphragmatic hernia without obstruction or gangrene: Secondary | ICD-10-CM

## 2013-12-30 DIAGNOSIS — Z09 Encounter for follow-up examination after completed treatment for conditions other than malignant neoplasm: Secondary | ICD-10-CM

## 2013-12-30 DIAGNOSIS — M25572 Pain in left ankle and joints of left foot: Secondary | ICD-10-CM

## 2013-12-30 DIAGNOSIS — N529 Male erectile dysfunction, unspecified: Secondary | ICD-10-CM

## 2013-12-30 LAB — COMPREHENSIVE METABOLIC PANEL WITH GFR
AST: 28 U/L (ref 0–37)
Albumin: 4.1 g/dL (ref 3.5–5.2)
Alkaline Phosphatase: 83 U/L (ref 39–117)
Chloride: 106 meq/L (ref 96–112)
Potassium: 4.2 meq/L (ref 3.5–5.3)
Sodium: 139 meq/L (ref 135–145)

## 2013-12-30 LAB — COMPREHENSIVE METABOLIC PANEL
ALT: 28 U/L (ref 0–53)
BUN: 15 mg/dL (ref 6–23)
CO2: 27 mEq/L (ref 19–32)
Calcium: 9 mg/dL (ref 8.4–10.5)
Creat: 1.02 mg/dL (ref 0.50–1.35)
Glucose, Bld: 107 mg/dL — ABNORMAL HIGH (ref 70–99)
Total Bilirubin: 0.4 mg/dL (ref 0.2–1.2)
Total Protein: 7.1 g/dL (ref 6.0–8.3)

## 2013-12-30 MED ORDER — FUROSEMIDE 20 MG PO TABS: ORAL_TABLET | ORAL | Status: DC

## 2013-12-30 MED ORDER — SILDENAFIL CITRATE 100 MG PO TABS: 50.0000 mg | ORAL_TABLET | Freq: Every day | ORAL | Status: DC | PRN

## 2013-12-30 NOTE — Progress Notes (Addendum)
 Chief Complaint:  Chief Complaint  Patient presents with  . Follow-up    with Dr. --patient was in the hosptial for stomach pain  . Toe Pain    left foot    HPI: Antonio Cabrera. is a 58 y.o. male who is here for evaluation after hospital discharge from [facility] from 12/19/2013-12/22/2013 for probable ileus  Which resolved at time of discharge.  During his hospitalization he was NPO, was given IVF and had an NG tube placed. He had a CT scan of the abdomen and pelvis  Which showed fluid filled loops of small and large bowel which could reflect underlying ileues. Moderater hiatal hernia. No mechanical obstruction was seen. At time of discharge he was able to tolerate some solids but he still complains of stomach bloating and abd pain after he eats.   He also had labs done: CMP showed elevated liver enzymes ( AST 120, ALT 121)  But subsequent hepatitis profile was negative. His LFTs in our office have always been normal.  He also had an echo done which showed normal LV EF at 55-60%, grade 1 diastolic dysfunction and normal right ventricular systolic  function based on DC summary.  He was found to have subclinical hypothyroidism, TSH was 7.5 and was put on synthroid but he does not remember the dose. He actually had a TSH done here in 08/2013 and that was normal.   His other complaints besides his stomach bloating include bilateral leg edema, weight gain, and also erectile dysfunction ( since he has been off methadone his libido has been very strong but he has a hard time maintaining an erection, gettign an erection is not an issue), and also he stubbed his left toe on a dresser last night and has severe pain inehis left pinky toe  And he thinks it is broken. He would like an xray.   Normal weight for him is 265 lb, he states the rest of this was "water". He states his eating is the same ,he feels nauseated when food is in front of him, he feel his stomach "blows up" when  he even looks at food.    Of pertinent interest: Mr Kishimoto is a chronic pain patient who used to be on methadone, then due to a serendiptous series of events he was unable to get methadone from his PCP at [facility] because no one in her practice would prescirbe it to him while she was on vacation, and subsequently he just decided he wanted to be off of the methadone. He was also discharged from the practice  because he emissed his appts. So he tapered himself of f his methadone and some of his other chronic medicines. He came to ot see [place] and has been seeing me consistently for several months. He was palced on norco for his pain control. I had referred him to the [facility] but according to the patient he got into an argument with the director over some misunderstanding and got discahrged. He does not want to go to the [facility] since he does not want to be on any meds stronger than what he is on right now.   I have referred him to neuropsychiatry  Due to his many and high doses of psychaitric meds he is on for PTSD, RLS, and insomnia but the next appt they have is in Nov and he is not sure he can make it, [place] . He is also trying to get POA from his brother over his parent's medical care and finances since he feels his brother is not well intentioned and up to the task.   Also he has had a cousin who recently committed suicide in the midst of his mother's failing health and his litigation battel with his brother.    Wt Readings from Last 3 Encounters:  12/30/13 285 lb 3.2 oz (129.366 kg)  12/04/13 284 lb (128.822 kg)  11/29/13 285 lb 9.6 oz (129.547 kg)     Past Medical History  Diagnosis Date  . Arthritis   . Depression   . Allergy   . Hypoglycemia   . GERD (gastroesophageal reflux disease)   . PTSD (post-traumatic stress disorder)   . RLS (restless legs syndrome)   . Neuromuscular disorder    Past Surgical History    Procedure Laterality Date  . Cholecystectomy  2000  . Cervical fusion      C4-C6  . Lumbar fusion      L5-S1  . Nissen fundoplication    . Ankle arthroscopy    . Knee arthroscopy    . Hernia repair    . Vasectomy    . Spine surgery      Dr Laymond Purserroy Allen ( [place] 58[place]    History   Social History  . Marital Status: Married    Spouse Name: N/A    Number of Children: N/A  . Years of Education: 16   Occupational History  . Retired     Retired [employer] Service   Social History Main Topics  . Smoking status: Never Smoker   . Smokeless tobacco: Never Used  . Alcohol Use: Yes     Comment: occasional-socially  . Drug Use: No  . Sexual Activity: Yes    Birth Control/ Protection: Surgical   Other Topics Concern  . None   Social History Narrative  . None   Family History  Problem Relation Age of Onset  . Breast cancer Mother   . Cancer Mother   . Stroke Father   . Hypertension Father   . Diabetes Father   . Alzheimer's disease Father   . Alcohol abuse Brother    Allergies  Allergen Reactions  . Antihistamines, Chlorpheniramine-Type Other (See Comments)    Skin crawling, agitation  . Decongestant [Pseudoephedrine] Other (See Comments)    Skin crawling  . Penicillins Rash   Prior to Admission medications   Medication Sig Start Date End Date Taking? Authorizing Provider  albuterol (PROVENTIL HFA;VENTOLIN HFA) 108 (90 BASE) MCG/ACT inhaler Inhale 2 puffs into the lungs every 6 (six) hours as needed for wheezing or shortness of breath. May cause jitteryness 12/04/13  Yes  P , DO  ALPRAZolam (XANAX) 1 MG tablet Take 1-2 mg by mouth 2 (two) times daily. Take 1 tablet every morning and take 2 tablets every evening   Yes Historical Provider, MD  benzonatate (TESSALON) 100 MG capsule Take 2 capsules (200 mg total) by mouth 2 (two) times daily as needed. 12/04/13  Yes  P , DO  diazepam (VALIUM) 10 MG tablet Take 1 tablet (10 mg total) by  mouth every 12 (twelve) hours as needed for anxiety. May fill on 9/30 11/29/13  Yes  P , DO  fluticasone (FLONASE) 50 MCG/ACT nasal spray Place 2 sprays into both nostrils daily. 11/29/13  Yes  P , DO  Guaifenesin (MUCINEX MAXIMUM STRENGTH) 1200 MG TB12 Take 1 tablet (  1,200 mg total) by mouth every 12 (twelve) hours as needed. 10/25/13  Yes Sherren Mocha, MD  HYDROcodone-acetaminophen (NORCO) 5-325 MG per tablet Take 1 tablet by mouth every 8 (eight) hours as needed for moderate pain. May fill on 12/11/13 11/29/13  Yes  P , DO  HYDROcodone-homatropine (HYCODAN) 5-1.5 MG/5ML syrup Take 5 mLs by mouth every 8 (eight) hours as needed for cough. 12/04/13  Yes  P , DO  hyoscyamine (LEVSIN, ANASPAZ) 0.125 MG tablet Take 1 tablet (0.125 mg total) by mouth 3 (three) times daily as needed for cramping. 02/05/13  Yes Lorre Munroe, NP  omeprazole (PRILOSEC) 20 MG capsule Take 1 capsule (20 mg total) by mouth daily. 11/29/13  Yes  P , DO  QUEtiapine (SEROQUEL) 300 MG tablet Take 300 mg by mouth at bedtime.   Yes Historical Provider, MD  rOPINIRole (REQUIP) 2 MG tablet Take 1 tablet (2 mg total) by mouth at bedtime. 12/07/13  Yes  P , DO  traZODone (DESYREL) 50 MG tablet TAKE 1 TABLET (50 MG TOTAL) BY MOUTH AT BEDTIME. 12/26/13  Yes Ethelda Chick, MD  azithromycin (ZITHROMAX) 250 MG tablet Take 2 tabs po now then 1 tab po daily for the next 4 days 12/04/13    P , DO     ROS: The patient denies fevers, chills, night sweats, unintentional weight loss, chest pain, palpitations, wheezing, dyspnea on exertion,  vomiting,dysuria, hematuria, melena, numbness, weakness, or tingling.   All other systems have been reviewed and were otherwise negative with the exception of those mentioned in the HPI and as above.    PHYSICAL EXAM: Filed Vitals:   12/30/13 0919  BP: 128/82  Pulse: 94  Temp: 97.8 F (36.6 C)  Resp: 18   Filed Vitals:   12/30/13 0919  Height: 6\' 2"  (1.88 m)    Weight: 285 lb 3.2 oz (129.366 kg)   Body mass index is 36.6 kg/(m^2).  General: Alert, no acute distress HEENT:  Normocephalic, atraumatic, oropharynx patent. EOMI, PERRLA Cardiovascular:  Regular rate and rhythm, no rubs murmurs or gallops.  No Carotid bruits, radial pulse intact. Trace pedal edema.  Respiratory: Clear to auscultation bilaterally.  No wheezes, rales, or rhonchi.  No cyanosis, no use of accessory musculature GI: No organomegaly, abdomen is soft and non-tender, positive bowel sounds.  No masses. + vertical abd scar Skin: No rashes. Neurologic: Facial musculature symmetric. Psychiatric: Patient is appropriate throughout our interaction. Lymphatic: No cervical lymphadenopathy Musculoskeletal: Gait intact.  + left 4th toe-tender, no swelling, no bruising + DP, full ROm, tender on palpation   LABS: Results for orders placed in visit on 12/30/13  COMPREHENSIVE METABOLIC PANEL      Result Value Ref Range   Sodium 139  135 - 145 mEq/L   Potassium 4.2  3.5 - 5.3 mEq/L   Chloride 106  96 - 112 mEq/L   CO2 27  19 - 32 mEq/L   Glucose, Bld 107 (*) 70 - 99 mg/dL   BUN 15  6 - 23 mg/dL   Creat 5.40  9.81 - 1.91 mg/dL   Total Bilirubin 0.4  0.2 - 1.2 mg/dL   Alkaline Phosphatase 83  39 - 117 U/L   AST 28  0 - 37 U/L   ALT 28  0 - 53 U/L   Total Protein 7.1  6.0 - 8.3 g/dL   Albumin 4.1  3.5 - 5.2 g/dL   Calcium 9.0  8.4 - 47.8 mg/dL  EKG/XRAY:   [facility]. Neg for acute  fx or dislocation of foot + mod stool burden, no obstrcution   ASSESSMENT/PLAN: Encounter Diagnoses  Name Primary?  . Ileus   . Pain in joint, ankle and foot, left   . Hospital discharge follow-up Yes  . Pedal edema   . Erectile dysfunction, unspecified erectile dysfunction type   . Hiatal hernia   . Diastolic dysfunction without heart failure     Mr Earl GalaOsborne is a 58 y/o male with a complicated medical hx including a  PMH of PTSD, RLS, GAD/depression,  hiatal hernia s/p nissen repair x 2, multiple back surgeries, chronic pain syndrome with prior use of methadone for >10 years  who was recently weaned off of methadone in 08/2013. He is on high dose antipsychotropic drugs and also norco for his chronic problems.   1. Constipation/ileus/haital hernia/GERD-Refer to GI for hiatal hernia, advise to take miralax daily since on chronic Norco. I can always switch him to Amitiza if it is narcotic related constipation as long it is not the hiatal hernia that is not causing him to feel bloated and nauseated with food. Change prilosec to nexium since he states it is better  2. Trace pedal edema  With recent echocardiogram showing Grade 1 diastolic dysfunction-Refer to cardiology,  Rx Lasix 10 mg daily and see if his pedal edema improves.  If his BP tolerates and he is still having pedal edema, then can consider increasing Lasix to 20 mg daily. There is trace edema, he is "extremely concerned about this". I do not feel he needs to exceed more than the minimum amount of lasix since he has recurrent issues with vertigo.   3. He was placed on Levothyroxine ? 25 mcg during his hosptial stay, TSH was 7.5  He is going to call me with the right dosage. I will keep him on this for now and see how he does with his energy level and weight gain.   4. Will ask referrals to call him with the name of the neuropsychiatrist so he can make his own appt base don his schedule.   5. Chronic pain-He knows that he can get only pain meds from me.   6. Erectile dysfunction-trial of viagra prn, precautions given for low BP and dizziness. PSA was normal in last 2  months, I did a prostate exam at some point and it did not show BPH.   7. Xray of foot-neg for fracture or dislocation, it is jsut a toe contusion  F/u with Dr Katrinka BlazingSmith on Wednesday for recheck if tolerating lasix 10 mg daily ok or if need to increase to 20 mg daily or prn. I don;t feel the need to increase his lasix to higher  than 20 mg due to his hx of recurrrent vertigo and all these other meds he is on that can potentially cause him to be dizzy.    More than 45 min face to face time was spent with the patient.  Gross sideeffects, risk and benefits, and alternatives of medications d/w patient. Patient is aware that all medications have potential sideeffects and we are unable to predict every sideeffect or drug-drug interaction that may occur.  Hamilton CapriLE,  PHUONG, DO 12/30/2013 5:56 PM

## 2013-12-31 ENCOUNTER — Telehealth: Payer: Self-pay

## 2013-12-31 ENCOUNTER — Encounter: Payer: Self-pay | Admitting: Gastroenterology

## 2013-12-31 NOTE — Telephone Encounter (Signed)
Referrals called patient and told him to call dr Lorenda Ishiharamojeed at [phone] to reschedule his appt

## 2013-12-31 NOTE — Telephone Encounter (Signed)
Dr. Conley RollsLe, pt needs a RF of Norco and Diazepam. Forgot to ask about this yesterday.

## 2014-01-01 ENCOUNTER — Telehealth: Payer: Self-pay | Admitting: Family Medicine

## 2014-01-01 ENCOUNTER — Ambulatory Visit: Payer: Self-pay | Admitting: Family Medicine

## 2014-01-01 ENCOUNTER — Telehealth: Payer: Self-pay | Admitting: Radiology

## 2014-01-01 ENCOUNTER — Encounter: Payer: Self-pay | Admitting: Family Medicine

## 2014-01-01 ENCOUNTER — Other Ambulatory Visit: Payer: Self-pay | Admitting: Family Medicine

## 2014-01-01 DIAGNOSIS — F411 Generalized anxiety disorder: Secondary | ICD-10-CM

## 2014-01-01 DIAGNOSIS — G894 Chronic pain syndrome: Secondary | ICD-10-CM

## 2014-01-01 MED ORDER — QUETIAPINE FUMARATE 300 MG PO TABS: 300.0000 mg | ORAL_TABLET | Freq: Every day | ORAL | Status: DC

## 2014-01-01 MED ORDER — HYDROCODONE-ACETAMINOPHEN 5-325 MG PO TABS
1.0000 | ORAL_TABLET | Freq: Three times a day (TID) | ORAL | Status: DC | PRN
Start: 2014-01-01 — End: 2014-01-31

## 2014-01-01 MED ORDER — DIAZEPAM 10 MG PO TABS: 10.0000 mg | ORAL_TABLET | Freq: Two times a day (BID) | ORAL | Status: DC | PRN

## 2014-01-01 NOTE — Telephone Encounter (Signed)
Refill request sent to Dr.  Conley RollsLe

## 2014-01-01 NOTE — Telephone Encounter (Signed)
Patient requesting refill on diazapam and norco. He states he forgot to get these at last OV. He is also calling Dr. Lorenda IshiharaMojeed back to reschedule. See previous messages regarding that. Please advise on refills.

## 2014-01-01 NOTE — Telephone Encounter (Signed)
PATIENT CALLED AND WANTED TO SPEAK DIRECTLY WITH DR. LE. HE SAID HE IS VERY UPSET THAT HE WAS NOT TOLD THAT HE WOULD HAVE TO SEE ANOTHER DOCTOR IN ORDER TO GET REFILLS ON HIS MEDICATIONS. HE WOULD LIKE HER TO BE CALLED AT HOME AND CALL HIM BACK IMMEDIATELY. BEST PHONE [phone] (CELL)  MBC

## 2014-01-01 NOTE — Telephone Encounter (Signed)
I spoke with the patient, there was some confusion from our staff. I am sending him to see a neuropsychiatrist to take care of his PTSD, RLS, insomnia, GAD/Depression ; not for his back pain. I can understand why he would be upset after being told misinformation. I will refill his norco and valium when I get back into the office today. He has a refill on seroquel. [place]ed [place] [facility]

## 2014-01-01 NOTE — Telephone Encounter (Signed)
Dr. Conley RollsLe,  Pt called this morning very upset. He does not understand why he is being sent to see psychiatry. I told him that after a certain amount of time filling certain rx's that a doctor will send a pt to psychiatry to have them fill their meds and see if there are any changes that need to be made. He says he needs a rx for his diazepam and and Norco because they are due to be filled on 10/30 and he will be heading out of town so he wants them right away. Pt says he is shaking and that his BP medication is very high because of the psychiatry situation. He feels like he was "thrown a curve ball."  He was saying that he wanted you to call him right away because he is in an "[place]." I told him you will be in at 4 this afternoon and you could call him then. He insisted that you call him, but I told him he will have to wait until you come into the office.

## 2014-01-01 NOTE — Telephone Encounter (Signed)
Dr. Conley RollsLe,  Pt called this morning very upset. He does not understand why he is being sent to see psychiatry. I told him that after a certain amount of time filling certain rx's that a doctor will send a pt to psychiatry to have them fill their meds and see if there are any changes that need to be made. He says he needs a rx for his diazepam and and Norco because they are due to be filled on 10/30 and he will be heading out of town so he wants them right away.  Pt says he is shaking and that his BP medication is very high because of the psychiatry situation. He feels like he was "thrown a curve ball."  He was saying that he wanted you to call him right away because he is in an "emergency." I told him you will be in at 4 this afternoon and you could call him then. He insisted that you call him, but I told him he will have to wait until you come into the office.    Original phone message documented under wrong employee name.

## 2014-01-02 ENCOUNTER — Encounter: Payer: Self-pay | Admitting: *Deleted

## 2014-01-06 ENCOUNTER — Encounter: Payer: Federal, State, Local not specified - PPO | Admitting: Family Medicine

## 2014-01-08 ENCOUNTER — Telehealth: Payer: Self-pay

## 2014-01-08 DIAGNOSIS — G2581 Restless legs syndrome: Secondary | ICD-10-CM

## 2014-01-08 MED ORDER — GABAPENTIN 100 MG PO CAPS: ORAL_CAPSULE | ORAL | Status: DC

## 2014-01-08 NOTE — Telephone Encounter (Signed)
Spoke with patient, he is taking his wife's neurontin 400 mg at night , is helping with his RLS.

## 2014-01-08 NOTE — Telephone Encounter (Signed)
Pt states he is having a terrible time with his restless leg syndrome and would like Dr Conley RollsLe to call him   Best phone [phone]

## 2014-01-08 NOTE — Telephone Encounter (Signed)
Pt states that he does not think that there is a reason to come in to the office. He is having increasing problems with his restless leg- Dr. Conley RollsLe knows all about it and he would like her to call him directly before seeing her patients tonight.

## 2014-01-09 ENCOUNTER — Encounter: Payer: Self-pay | Admitting: Gastroenterology

## 2014-01-09 ENCOUNTER — Ambulatory Visit (INDEPENDENT_AMBULATORY_CARE_PROVIDER_SITE_OTHER): Payer: Federal, State, Local not specified - PPO | Admitting: Gastroenterology

## 2014-01-09 VITALS — BP 110/74 | HR 84 | Ht 74.0 in | Wt 291.0 lb

## 2014-01-09 DIAGNOSIS — K449 Diaphragmatic hernia without obstruction or gangrene: Secondary | ICD-10-CM

## 2014-01-09 DIAGNOSIS — R140 Abdominal distension (gaseous): Secondary | ICD-10-CM | POA: Insufficient documentation

## 2014-01-09 NOTE — Patient Instructions (Signed)
Please [facility] in today so Doug Sou[place]ica Zehr, [place] may review them  We need you to figure out who performed your Colonoscopy 3 and 1/2 years ago so we can obtain the records for review  Please purchase Gaviscon over the counter and take as directed for bloating

## 2014-01-09 NOTE — Progress Notes (Addendum)
01/09/2014 Antonio HardingJames Houchen Cabrera. [id number] [birth year]   HISTORY OF PRESENT ILLNESS:  This is a 58 year old male who is new to our practice and referred by Antonio. Conley RollsLe for abdominal bloating and apparent hiatal hernia.  He was 18 minutes late for his new patient visit today then also forgot to bring his records that he had at home from his recent hospitalization.  He was apparently recently hospitalized at [facility] for abdominal bloating and distention.  He says that the CT scan showed a "big ass hiatal hernia and large air pockets".  He says that he had two failed Nissen fundoplications in 2003 and 2005.  Today he complains of abdominal distention and bloating that makes him short of breath.  Antonio. Irwin Cabrera's note says that he was hospitalized at [facility] from 12/19/2013-12/22/2013 for probable ileus, which resolved at time of discharge.  During his hospitalization he was NPO, was given IVF, and had an NG tube placed. He had a CT scan of the abdomen and pelvis which showed fluid filled loops of small and large bowel which could reflect underlying ileus, moderate hiatal hernia. No mechanical obstruction was seen. At time of discharge he was able to tolerate some solids but he still complains of stomach bloating especially after he eats.   He had a rude attitude during his visit today.  He has PMH of PTSD, RLS, GAD, and insomnia as well as chronic pain and used to be on methadone.  Then due to a serendiptous series of events he was unable to get methadone from his PCP at [facility] because no one in her practice would prescirbe it to him while she was on vacation, and subsequently he just decided he wanted to be off of the methadone. He was also discharged from the practice because he missed his appts. So he tapered himself off his methadone and some of his other chronic medicines.  He was referred to the [facility] but according to the patient he got into an argument with the director over some  misunderstanding and got discharged.  This is all according to Antonio. Irwin Cabrera's note from 12/30/2013.  He is convinced that the hiatal hernia is the cause of his symptoms and wants it fixed.  I explained to him that we do not do that here and he would need to see a surgeon for that.  He says that he is moving his bowels.  Denies abdominal pain except for in his epigastrium.  Does not feel like he is having a lot of reflux/heartburn.  He is currently on omeprazole 20 mg daily, carafate BID, and levsin prn.  CMP on 10/19 was normal.   Past Medical History  Diagnosis Date  . Arthritis   . Depression   . Allergy   . Hypoglycemia   . GERD (gastroesophageal reflux disease)   . PTSD (post-traumatic stress disorder)   . RLS (restless legs syndrome)   . Neuromuscular disorder   . Chronic back pain   . Anxiety    Past Surgical History  Procedure Laterality Date  . Cholecystectomy  2000  . Cervical fusion      C4-C6  . Lumbar fusion      L5-S1  . Nissen fundoplication    . Ankle arthroscopy    . Knee arthroscopy    . Hernia repair    . Vasectomy    . Spine surgery      Antonio Cabrera ( [place]ase[place] in [place] age  26)     reports that he has never smoked. He has never used smokeless tobacco. He reports that he drinks alcohol. He reports that he does not use illicit drugs. family history includes Alcohol abuse in his brother; Alzheimer's disease in his father; Breast cancer in his mother; Cancer in his mother; Diabetes in his father; Hypertension in his father; Stroke in his father. Allergies  Allergen Reactions  . Antihistamines, Chlorpheniramine-Type Other (See Comments)    Skin crawling, agitation  . Decongestant [Pseudoephedrine] Other (See Comments)    Skin crawling  . Penicillins Rash      Outpatient Encounter Prescriptions as of 01/09/2014  Medication Sig  . diazepam (VALIUM) 10 MG tablet Take 1 tablet (10 mg total) by mouth every 12 (twelve) hours as needed for anxiety.  May refill 01/10/14  . fluticasone (FLONASE) 50 MCG/ACT nasal spray Place 2 sprays into both nostrils daily.  Marland Kitchen. gabapentin (NEURONTIN) 100 MG capsule Take 3 tabs PO qhs for RLS.  Marland Kitchen. HYDROcodone-acetaminophen (NORCO) 5-325 MG per tablet Take 1 tablet by mouth every 8 (eight) hours as needed for moderate pain. May fill on 01/10/14  . hyoscyamine (LEVSIN, ANASPAZ) 0.125 MG tablet Take 1 tablet (0.125 mg total) by mouth 3 (three) times daily as needed for cramping.  Marland Kitchen. levothyroxine (SYNTHROID, LEVOTHROID) 100 MCG tablet   . omeprazole (PRILOSEC) 20 MG capsule Take 1 capsule (20 mg total) by mouth daily.  . QUEtiapine (SEROQUEL) 300 MG tablet Take 1 tablet (300 mg total) by mouth at bedtime.  Marland Kitchen. rOPINIRole (REQUIP) 2 MG tablet Take 1 tablet (2 mg total) by mouth at bedtime.  . sildenafil (VIAGRA) 100 MG tablet Take 0.5 tablets (50 mg total) by mouth daily as needed for erectile dysfunction. Monitor blood pressure, dizziness.  . sucralfate (CARAFATE) 1 G tablet   . traZODone (DESYREL) 50 MG tablet TAKE 1 TABLET (50 MG TOTAL) BY MOUTH AT BEDTIME.  . [DISCONTINUED] furosemide (LASIX) 20 MG tablet Take 1/2 tab po daily     REVIEW OF SYSTEMS  : All other systems reviewed and negative except where noted in the History of Present Illness.   PHYSICAL EXAM: BP 110/74  Pulse 84  Ht 6\' 2"  (1.88 m)  Wt 291 lb (131.997 kg)  BMI 37.35 kg/m2 General: Well developed white male in no acute distress Head: Normocephalic and atraumatic Eyes:  Sclerae anicteric, conjunctiva pink. Ears: Normal auditory acuity Lungs: Clear throughout to auscultation Heart: Regular rate and rhythm Abdomen: Soft, obese, non-distended.  Normal bowel sounds.  Vertical scar noted on abdomen.  Mild epigastric TTP without R/R/G. Musculoskeletal: Symmetrical with no gross deformities  Skin: No lesions on visible extremities Extremities: No edema  Neurological: Alert oriented x 4, grossly non-focal Psychological:  Alert and  cooperative. Normal mood and affect  ASSESSMENT AND PLAN: -58 year old male with apparent recent hospitalization for ileus.  Has history of hiatal hernia, which patient thinks is the cause for his abdominal bloating and distention currently.  He has records at home from his CT scan and recent hospitalization but forgot to bring those today.  He is on omeprazole 20 mg daily, carafate BID, and levsin prn.  I have asked him to please bring us his records today so that we can review them and make further decisions regarding his care.  EGD will likely be the next step since surgeon's would want that performed before performing any surgery.  In the interim I recommended that he try Gaviscon for the bloating and continue  his current medications.  **He also had colonoscopy 3.5 year ago in [place], but does not recall the physician's name.  I have asked him to please try to find out who did the procedure so we can try to obtain those records as well.  Addendum: Reviewed and agree with initial management. Await previous evaluation with both imaging and endoscopy before making further decisions. Short follow-up Beverley Fiedler, MD

## 2014-01-09 NOTE — Progress Notes (Signed)
This encounter was created in error - please disregard.

## 2014-01-23 ENCOUNTER — Telehealth: Payer: Self-pay | Admitting: *Deleted

## 2014-01-23 NOTE — Telephone Encounter (Signed)
He needs to keep the appt, if he is worried then he can get evaluated with us at the urgent care and then we can call the on call doctor or he can go to the ER. Thanks Dr Conley RollsLe

## 2014-01-23 NOTE — Telephone Encounter (Signed)
Pt called and talked to Lupita LeashDonna about moving his referral to cardiologist up. He went to the GI specialist yesterday and the GI doctor has noticed that his pedal edema has increased. This Dr. Has pt very worried about his condition worsening.    Dr. Tenny Crawoss  [phone] Called the office and they do not have any availability sooner than 01/30/14. If we felt it necessary for pt to be evaluated sooner than the provider would need to call and talk to the Dr of the day.

## 2014-01-23 NOTE — Telephone Encounter (Signed)
Pt advised. Pt is feeling much worse. Advised to call us first thing in the a.m and see if we could fit him in to the appt center. Dr. Conley RollsLe, is this ok with you?

## 2014-01-24 ENCOUNTER — Emergency Department (HOSPITAL_COMMUNITY): Payer: Federal, State, Local not specified - PPO

## 2014-01-24 ENCOUNTER — Ambulatory Visit (INDEPENDENT_AMBULATORY_CARE_PROVIDER_SITE_OTHER): Payer: Federal, State, Local not specified - PPO

## 2014-01-24 ENCOUNTER — Telehealth: Payer: Self-pay

## 2014-01-24 ENCOUNTER — Inpatient Hospital Stay (HOSPITAL_COMMUNITY)
Admission: EM | Admit: 2014-01-24 | Discharge: 2014-01-27 | DRG: 286 | Disposition: A | Discharge status: Home / Self Care | Payer: Federal, State, Local not specified - PPO | Attending: Cardiology | Admitting: Cardiology

## 2014-01-24 ENCOUNTER — Encounter (HOSPITAL_COMMUNITY): Payer: Self-pay | Admitting: *Deleted

## 2014-01-24 ENCOUNTER — Other Ambulatory Visit: Payer: Self-pay

## 2014-01-24 ENCOUNTER — Ambulatory Visit (INDEPENDENT_AMBULATORY_CARE_PROVIDER_SITE_OTHER): Payer: Federal, State, Local not specified - PPO | Admitting: Emergency Medicine

## 2014-01-24 VITALS — BP 134/94 | HR 85 | Temp 98.7°F | Resp 18 | Wt 288.2 lb

## 2014-01-24 DIAGNOSIS — R739 Hyperglycemia, unspecified: Secondary | ICD-10-CM | POA: Diagnosis present

## 2014-01-24 DIAGNOSIS — G8929 Other chronic pain: Secondary | ICD-10-CM | POA: Diagnosis present

## 2014-01-24 DIAGNOSIS — F4310 Post-traumatic stress disorder, unspecified: Secondary | ICD-10-CM | POA: Diagnosis present

## 2014-01-24 DIAGNOSIS — Z981 Arthrodesis status: Secondary | ICD-10-CM | POA: Diagnosis not present

## 2014-01-24 DIAGNOSIS — I509 Heart failure, unspecified: Secondary | ICD-10-CM

## 2014-01-24 DIAGNOSIS — I5031 Acute diastolic (congestive) heart failure: Secondary | ICD-10-CM | POA: Diagnosis present

## 2014-01-24 DIAGNOSIS — I200 Unstable angina: Secondary | ICD-10-CM

## 2014-01-24 DIAGNOSIS — R7309 Other abnormal glucose: Secondary | ICD-10-CM

## 2014-01-24 DIAGNOSIS — F32A Depression, unspecified: Secondary | ICD-10-CM

## 2014-01-24 DIAGNOSIS — M79606 Pain in leg, unspecified: Secondary | ICD-10-CM

## 2014-01-24 DIAGNOSIS — G2581 Restless legs syndrome: Secondary | ICD-10-CM | POA: Diagnosis present

## 2014-01-24 DIAGNOSIS — F329 Major depressive disorder, single episode, unspecified: Secondary | ICD-10-CM

## 2014-01-24 DIAGNOSIS — R0789 Other chest pain: Secondary | ICD-10-CM

## 2014-01-24 DIAGNOSIS — Z7951 Long term (current) use of inhaled steroids: Secondary | ICD-10-CM

## 2014-01-24 DIAGNOSIS — R9431 Abnormal electrocardiogram [ECG] [EKG]: Secondary | ICD-10-CM

## 2014-01-24 DIAGNOSIS — Z6836 Body mass index (BMI) 36.0-36.9, adult: Secondary | ICD-10-CM

## 2014-01-24 DIAGNOSIS — R202 Paresthesia of skin: Secondary | ICD-10-CM

## 2014-01-24 DIAGNOSIS — R079 Chest pain, unspecified: Secondary | ICD-10-CM

## 2014-01-24 DIAGNOSIS — I252 Old myocardial infarction: Secondary | ICD-10-CM | POA: Diagnosis not present

## 2014-01-24 DIAGNOSIS — F418 Other specified anxiety disorders: Secondary | ICD-10-CM | POA: Diagnosis present

## 2014-01-24 DIAGNOSIS — R109 Unspecified abdominal pain: Secondary | ICD-10-CM | POA: Diagnosis present

## 2014-01-24 DIAGNOSIS — K219 Gastro-esophageal reflux disease without esophagitis: Secondary | ICD-10-CM

## 2014-01-24 DIAGNOSIS — R209 Unspecified disturbances of skin sensation: Secondary | ICD-10-CM | POA: Diagnosis present

## 2014-01-24 DIAGNOSIS — K449 Diaphragmatic hernia without obstruction or gangrene: Secondary | ICD-10-CM

## 2014-01-24 DIAGNOSIS — I50811 Acute right heart failure: Secondary | ICD-10-CM

## 2014-01-24 DIAGNOSIS — R140 Abdominal distension (gaseous): Secondary | ICD-10-CM

## 2014-01-24 DIAGNOSIS — G894 Chronic pain syndrome: Secondary | ICD-10-CM

## 2014-01-24 DIAGNOSIS — F419 Anxiety disorder, unspecified: Secondary | ICD-10-CM

## 2014-01-24 LAB — BASIC METABOLIC PANEL
Anion gap: 15 (ref 5–15)
BUN: 18 mg/dL (ref 6–23)
CO2: 22 mEq/L (ref 19–32)
CREATININE: 1.03 mg/dL (ref 0.50–1.35)
Calcium: 9.1 mg/dL (ref 8.4–10.5)
Chloride: 104 mEq/L (ref 96–112)
GFR, EST NON AFRICAN AMERICAN: 79 mL/min — AB (ref >90)
Glucose, Bld: 143 mg/dL — ABNORMAL HIGH (ref 70–99)
POTASSIUM: 3.7 meq/L (ref 3.7–5.3)
Sodium: 141 mEq/L (ref 137–147)

## 2014-01-24 LAB — CBC
HCT: 37.8 % — ABNORMAL LOW (ref 39.0–52.0)
Hemoglobin: 12.2 g/dL — ABNORMAL LOW (ref 13.0–17.0)
MCH: 25.5 pg — AB (ref 26.0–34.0)
MCHC: 32.3 g/dL (ref 30.0–36.0)
MCV: 79.1 fL (ref 78.0–100.0)
PLATELETS: 297 10*3/uL (ref 150–400)
RBC: 4.78 MIL/uL (ref 4.22–5.81)
RDW: 15.1 % (ref 11.5–15.5)
WBC: 7 10*3/uL (ref 4.0–10.5)

## 2014-01-24 LAB — I-STAT TROPONIN, ED: TROPONIN I, POC: 0 ng/mL (ref 0.00–0.08)

## 2014-01-24 LAB — PRO B NATRIURETIC PEPTIDE: Pro B Natriuretic peptide (BNP): <5 pg/mL (ref 0–125)

## 2014-01-24 LAB — TROPONIN I

## 2014-01-24 MED ORDER — HEPARIN (PORCINE) IN NACL 100-0.45 UNIT/ML-% IJ SOLN
1800.0000 [IU]/h | INTRAMUSCULAR | Status: DC
  Administered 2014-01-24: 1500 [IU]/h via INTRAVENOUS
  Administered 2014-01-25 – 2014-01-26 (×2): 1950 [IU]/h via INTRAVENOUS
  Administered 2014-01-26: 1800 [IU]/h via INTRAVENOUS
  Filled 2014-01-24 (×6): qty 250

## 2014-01-24 MED ORDER — HEPARIN BOLUS VIA INFUSION
4000.0000 [IU] | Freq: Once | INTRAVENOUS | Status: AC
  Administered 2014-01-24: 4000 [IU] via INTRAVENOUS
  Filled 2014-01-24: qty 4000

## 2014-01-24 MED ORDER — ASPIRIN 81 MG PO CHEW
324.0000 mg | CHEWABLE_TABLET | Freq: Once | ORAL | Status: AC
  Administered 2014-01-24: 324 mg via ORAL
  Filled 2014-01-24: qty 4

## 2014-01-24 MED ORDER — SODIUM CHLORIDE 0.9 % IV BOLUS (SEPSIS)
500.0000 mL | Freq: Once | INTRAVENOUS | Status: AC
  Administered 2014-01-24: 500 mL via INTRAVENOUS

## 2014-01-24 MED ORDER — DIAZEPAM 5 MG PO TABS
10.0000 mg | ORAL_TABLET | Freq: Two times a day (BID) | ORAL | Status: DC | PRN
Start: 2014-01-24 — End: 2014-01-27
  Administered 2014-01-24 – 2014-01-26 (×4): 10 mg via ORAL
  Filled 2014-01-24 (×4): qty 2

## 2014-01-24 MED ORDER — NITROGLYCERIN IN D5W 200-5 MCG/ML-% IV SOLN
10.0000 ug/min | INTRAVENOUS | Status: DC
  Administered 2014-01-24: 10 ug/min via INTRAVENOUS
  Filled 2014-01-24: qty 250

## 2014-01-24 MED ORDER — NITROGLYCERIN 0.4 MG SL SUBL
0.4000 mg | SUBLINGUAL_TABLET | SUBLINGUAL | Status: DC | PRN
  Administered 2014-01-24: 0.4 mg via SUBLINGUAL
  Filled 2014-01-24: qty 1

## 2014-01-24 MED ORDER — GABAPENTIN 100 MG PO CAPS
100.0000 mg | ORAL_CAPSULE | Freq: Every day | ORAL | Status: DC
  Administered 2014-01-24: 100 mg via ORAL
  Filled 2014-01-24: qty 1

## 2014-01-24 MED ORDER — ROPINIROLE HCL 1 MG PO TABS
2.0000 mg | ORAL_TABLET | Freq: Every day | ORAL | Status: DC
  Administered 2014-01-24: 2 mg via ORAL
  Filled 2014-01-24 (×2): qty 2

## 2014-01-24 NOTE — ED Notes (Signed)
Admitting at the bedside.  

## 2014-01-24 NOTE — H&P (Addendum)
Admit date: 01/24/2014 Primary Physician  Erling Cruz, MD Primary Cardiologist  none  CC: chest pain  HPI: 58 year old male who reported to the emergency room with multiple complaints including 2 weeks of intermittent mid chest pain, dyspnea on exertion, edema. Went to an urgent care center as well as [facility] recently for hernia and was told that he had an abnormal EKG and at that time they wanted to admit him but he chose not to. The chest pain was radiating to his left jaw and shoulder. He also reported some nausea and vomiting and perhaps some tingling in his left arm and leg feeling numb which is been going on for over one month. He reports gaining approximate 30 pounds of weight. Most of this is been occurring over the past 3 weeks. His pain sometimes after exertion but today seems to be more at rest. Denies any significant orthopnea.  He also states that approximately one month ago he was at the [facility] and was admitted to the hospital  With abdominal distention, lower extremity edema. He had an NG tube placed. Did not have a cardiologist called. Does not remember taking Lasix during that hospitalization but has taken Lasix in the past as he states that this sometimes puts him on the floor.  He states that he does not have hepatitis, denies any alcohol or drug use.   He saw a physician at [facility] regarding his "hernia". Likely hiatal hernia. He has had 2 Nissen fundoplication's. He is on PPI. During his last visit recently, that physician commented that he needs to go see a cardiologist and had him scheduled over the next several weeks to see one because of right-sided heart failure.  [place] also [place] chest pain. He wonders if this was a possible MI.   He is not recall ever having an echocardiogram or cardiac catheterization.  He is currently having chest discomfort. He states that the nitroglycerin under his tongue does not touch it.  His troponin thus far is normal.   He has been given nitroglycerin in the emergency room but feels quite dizzy with this.  EKG personally viewed in the emergency room shows sinus rhythm, inferior infarct pattern as well as lateral/anterolateral infarct pattern, poor R wave progression noted.  Head CT was performed.  PMH:   Past Medical History  Diagnosis Date  . Arthritis   . Depression   . Allergy   . Hypoglycemia   . GERD (gastroesophageal reflux disease)   . PTSD (post-traumatic stress disorder)   . RLS (restless legs syndrome)   . Neuromuscular disorder   . Chronic back pain   . Anxiety     PSH:   Past Surgical History  Procedure Laterality Date  . Cholecystectomy  2000  . Cervical fusion      C4-C6  . Lumbar fusion      L5-S1  . Nissen fundoplication    . Ankle arthroscopy    . Knee arthroscopy    . Hernia repair    . Vasectomy    . Spine surgery      Dr Laymond Purser ( decease[place]urosur[place] in [place] age 45)    Allergies:  Antihistamines, chlorpheniramine-type; Decongestant; and Penicillins Prior to Admit Meds:   Prior to Admission medications   Medication Sig Start Date End Date Taking? Authorizing Provider  diazepam (VALIUM) 10 MG tablet Take 1 tablet (10 mg total) by mouth every 12 (twelve)  hours as needed for anxiety. May refill 01/10/14 01/01/14  Yes Thao P Le, DO  fluticasone (FLONASE) 50 MCG/ACT nasal spray Place 2 sprays into both nostrils daily. 11/29/13  Yes Thao P Le, DO  gabapentin (NEURONTIN) 100 MG capsule Take 3 tabs PO qhs for RLS. 01/08/14  Yes Thao P Le, DO  HYDROcodone-acetaminophen (NORCO) 5-325 MG per tablet Take 1 tablet by mouth every 8 (eight) hours as needed for moderate pain. May fill on 01/10/14 01/01/14  Yes Thao P Le, DO  hyoscyamine (LEVSIN, ANASPAZ) 0.125 MG tablet Take 1 tablet (0.125 mg total) by mouth 3 (three) times daily as needed for cramping. 02/05/13  Yes Lorre Munroeegina W Baity, NP  levothyroxine (SYNTHROID, LEVOTHROID) 100 MCG tablet   12/23/13  Yes Historical Provider, MD  omeprazole (PRILOSEC) 20 MG capsule Take 1 capsule (20 mg total) by mouth daily. 11/29/13  Yes Thao P Le, DO  QUEtiapine (SEROQUEL) 300 MG tablet Take 1 tablet (300 mg total) by mouth at bedtime. 01/01/14  Yes Thao P Le, DO  rOPINIRole (REQUIP) 2 MG tablet Take 1 tablet (2 mg total) by mouth at bedtime. 12/07/13  Yes Thao P Le, DO  sildenafil (VIAGRA) 100 MG tablet Take 0.5 tablets (50 mg total) by mouth daily as needed for erectile dysfunction. Monitor blood pressure, dizziness. 12/30/13  Yes Thao P Le, DO  sucralfate (CARAFATE) 1 G tablet  12/23/13  Yes Historical Provider, MD  traZODone (DESYREL) 50 MG tablet TAKE 1 TABLET (50 MG TOTAL) BY MOUTH AT BEDTIME. 12/26/13  Yes Ethelda ChickKristi M Smith, MD   Fam HX:    Family History  Problem Relation Age of Onset  . Breast cancer Mother   . Cancer Mother   . Stroke Father   . Hypertension Father   . Diabetes Father   . Alzheimer's disease Father   . Alcohol abuse Brother   no early family history of coronary artery disease. His mother is [id number]. Social HX:    History   Social History  . Marital Status: Married    Spouse Name: N/A    Number of Children: N/A  . Years of Education: 16   Occupational History  . Retired     Retired [employer] Service   Social History Main Topics  . Smoking status: Never Smoker   . Smokeless tobacco: Never Used  . Alcohol Use: Yes     Comment: occasional-socially  . Drug Use: No  . Sexual Activity: Yes    Birth Control/ Protection: Surgical   Other Topics Concern  . Not on file   Social History Narrative     ROS: multiple somatic complaints. No bleeding. No syncope. +GERD. All 11 ROS were addressed and are negative except what is stated in the HPI   Physical Exam: Blood pressure 122/76, pulse 76, temperature 98.1 F (36.7 C), temperature source Oral, resp. rate 16, SpO2 100 %.   General: Well developed, well nourished, in no acute distress Head: Eyes PERRLA, No  xanthomas.   Normal cephalic and atramatic  Lungs:  Clear bilaterally to auscultation and percussion. Normal respiratory effort. No wheezes, no rales. Heart:  HRRR S1 S2 Pulses are 2+ & equal. No murmurs, rubs or gallops.             No carotid bruit. No JVD.  No abdominal bruits. Abdomen: Mild distension. Bowel sounds are positive, abdomen soft and non-tender without masses or  Hernia's noted. No hepatosplenomegaly. Msk:  Back normal, normal gait. Normal strength and tone for age. Extremities:  No clubbing, cyanosis. 2+ bilateral edema.  DP +1 Neuro: Alert and oriented X 3, non-focal, MAE x 4 GU: Deferred Rectal: Deferred Psych:  Good affect, responds appropriately         Labs:   Lab Results  Component Value Date   WBC 7.0 01/24/2014   HGB 12.2* 01/24/2014   HCT 37.8* 01/24/2014   MCV 79.1 01/24/2014   PLT 297 01/24/2014    Recent Labs Lab 01/24/14 1732  NA 141  K 3.7  CL 104  CO2 22  BUN 18  CREATININE 1.03  CALCIUM 9.1  GLUCOSE 143*   No results for input(s): CKTOTAL, CKMB, TROPONINI in the last 72 hours. Lab Results  Component Value Date   CHOL 145 05/31/2012   HDL 30.70* 05/31/2012   LDLCALC 101* 05/31/2012   TRIG 65.0 05/31/2012   No results found for: DDIMER   Radiology:  Dg Chest 2 View  01/24/2014   CLINICAL DATA:  Chest pain 3 weeks with shortness of breath vomiting and cough for 2 days initial evaluation, nonsmoker  EXAM: CHEST  2 VIEW  COMPARISON:  None.  FINDINGS: Limited inspiratory effect exaggerating heart size. Allowing for this there is mild to moderate cardiac enlargement. Vascular pattern within normal limits. Lungs clear. No effusion or consolidation.  IMPRESSION: Enlargement of cardiac silhouette.  Otherwise no acute findings.   Electronically Signed   By: Esperanza Heiraymond  Rubner M.D.   On: 01/24/2014 19:22   Dg Chest 2 View  01/24/2014   CLINICAL DATA:  Two week history of chest pressure and difficulty breathing  EXAM: CHEST  2  VIEW  COMPARISON:  December 04, 2013  FINDINGS: There is no edema or consolidation. The heart is upper normal in size with pulmonary vascularity within normal limits. No adenopathy. No pneumothorax. No bone lesions.  IMPRESSION: No edema or consolidation.   Electronically Signed   By: Bretta BangWilliam  Woodruff M.D.   On: 01/24/2014 13:20   Personally viewed.   EKG:  01/24/14-Sinus rhythm with inferior infarct, anterolateral infarct pattern, poor R wave progression, left anterior fascicular block. Personally viewed.  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 Also notes multiple somatic complaints including left-sided tingling in different appendages. Head CT performed.  1. Unstable angina-given his EKG and overall presentation of intermittent chest discomfort now worse at rest, I agree with IV heparin as well as IV nitroglycerin to help alleviate possible anginal symptoms. His EKG is indicative of old infarcts and certainly he could have developed a cardiomyopathy with possible left ventricular as well as right ventricular dysfunction resulting in his weight gain and symptoms of acute heart failure. We will also start statin medication. His LDL is 101.  With ECG abnormality, signs of heart failure, chest pain, he will likely need LHC on Monday. Explained to patient.   2. Left-sided paresthesias-difficult to fully characterize. He does have a history of neuromuscular disorder, chronic back pain, anxiety as well as posttraumatic stress disorder. Perhaps this is playing a role. Head CT has been done.he does not appear to be having an acute stroke. He did not really bring up with me but did mention this to ER MD.   3. Acute heart failure-right-sided symptoms with increasing  edema  noted. IV furosemide 40 mg twice a day. We will check echocardiogram. Check TSH.  4. Morbid obesity-weight is 288 pounds. Encourage weight loss, decrease carbohydrates.  5. PTSD-continue with his anti-anxiety medications.  6. Elevated glucose-143 nonfasting. Check hemoglobin A1c.   We will go ahead and admit. It is likely that he will need to be here past to midnight.  Donato Schultz, MD  01/24/2014  7:50 PM

## 2014-01-24 NOTE — Telephone Encounter (Signed)
I have patient on the phone, but I am uncomfortable asking him to drive, he indicates he will ask his neighbor. Dr Conley RollsLe wants him to go to the ER< he wants a call back with an appointment. I have discussed with Dr Conley RollsLe, and he should go to the Urgent Care or to the Emergency room, we do not have the equipment at the appointment center to handle a cardiac emergency. Patient states he will get his neighbor to take him to the Urgent Care.

## 2014-01-24 NOTE — Patient Instructions (Signed)
Angina Pectoris  Angina pectoris, often just called angina, is extreme discomfort in your chest, neck, or arm caused by a lack of blood in the middle and thickest layer of your heart wall (myocardium). It may feel like tightness or heavy pressure. It may feel like a crushing or squeezing pain. Some people say it feels like gas or indigestion. It may go down your shoulders, back, and arms. Some people may have symptoms other than pain. These symptoms include fatigue, shortness of breath, cold sweats, or nausea. There are four different types of angina:  · Stable angina--Stable angina usually occurs in episodes of predictable frequency and duration. It usually is brought on by physical activity, emotional stress, or excitement. These are all times when the myocardium needs more oxygen. Stable angina usually lasts a few minutes and often is relieved by taking a medicine that can be taken under your tongue (sublingually). The medicine is called nitroglycerin. Stable angina is caused by a buildup of plaque inside the arteries, which restricts blood flow to the heart muscle (atherosclerosis).  · Unstable angina--Unstable angina can occur even when your body experiences little or no physical exertion. It can occur during sleep. It can also occur at rest. It can suddenly increase in severity or frequency. It might not be relieved by sublingual nitroglycerin. It can last up to 30 minutes. The most common cause of unstable angina is a blood clot that has developed on the top of plaque buildup inside a coronary artery. It can lead to a heart attack if the blood clot completely blocks the artery.  · Microvascular angina--This type of angina is caused by a disorder of tiny blood vessels called arterioles. Microvascular angina is more common in women. The pain may be more severe and last longer than other types of angina pectoris.  · Prinzmetal or variant angina--This type of angina pectoris usually occurs when your body  experiences little or no physical exertion. It especially occurs in the early morning hours. It is caused by a spasm of your coronary artery.  HOME CARE INSTRUCTIONS   · Only take over-the-counter and prescription medicines as directed by your health care provider.  · Stay active or increase your exercise as directed by your health care provider.  · Limit strenuous activity as directed by your health care provider.  · Limit heavy lifting as directed by your health care provider.  · Maintain a healthy weight.  · Learn about and eat heart-healthy foods.  · Do not use any tobacco products including cigarettes, chewing tobacco or electronic cigarettes.  SEEK IMMEDIATE MEDICAL CARE IF:   You experience the following symptoms:  · Chest, neck, deep shoulder, or arm pain or discomfort that lasts more than a few minutes.  · Chest, neck, deep shoulder, or arm pain or discomfort that goes away and comes back, repeatedly.  · Heavy sweating with discomfort, without a noticeable cause.  · Shortness of breath or difficulty breathing.  · Angina that does not get better after a few minutes of rest or after taking sublingual nitroglycerin.  These can all be symptoms of a heart attack, which is a medical emergency! Get medical help at once. Call your local emergency service (911 in [place] immediately. Do not  drive yourself to the hospital and do not  wait to for your symptoms to go away.  MAKE SURE YOU:  · Understand these instructions.  · Will watch your condition.  · Will get help right away if you are not   doing well or get worse.  Document Released: 02/28/2005 Document Revised: 03/05/2013 Document Reviewed: 07/02/2013  ExitCare® Patient Information ©2015 [employer]. This information is not intended to replace advice given to you by your health care provider. Make sure you discuss any questions you have with your health care provider.

## 2014-01-24 NOTE — Telephone Encounter (Signed)
yes

## 2014-01-24 NOTE — ED Notes (Signed)
Pt refused second nitro after feeling faint after first dose.  Doctor notified.

## 2014-01-24 NOTE — Telephone Encounter (Signed)
Pt here to be evaluated now.

## 2014-01-24 NOTE — ED Notes (Addendum)
Pt reports being seen at [facility] today and recommended to come to ED because an old heart attack showed up on the EKG.  Pt has central chest pain radiating to left jaw and shoulder.  Pt has sob, n/v. Pt reports gaining 30 lbs of water weight. Pt has right sided heart failure. Pt feels like left arm and leg is numb.

## 2014-01-24 NOTE — Telephone Encounter (Signed)
Pt called to talk to Dr Dareen PianoAnderson about being admitted to hospital. I spoke to Dr Dareen PianoAnderson who stated that he has advised pt to go [facility] to be evaluated for chest pain. I gave pt these instr's who had thought that Dr Dareen PianoAnderson was going to call the hosp to arrange to have him admitted. Explained that the best thing for him to do is to go to the [facility] and they can run any tests necessary to see what is causing his Sxs. Pt agreed and stated that he is almost there. I advised him that they will be able to see our notes.

## 2014-01-24 NOTE — ED Notes (Signed)
Dr. Criss AlvineGoldston notified pt didn't remember CT procedure.

## 2014-01-24 NOTE — ED Notes (Signed)
Pt has multiple complaints. Reports recent mid chest pains, sob and swelling to extremities. Reports hx of chf, went to an [facility] recently for hernia and was told he had abnormal ekg and they wanted to admit him but pt chose not to. Airway intact, ekg done at triage.

## 2014-01-24 NOTE — ED Notes (Signed)
Pt transported to xr/Ct.

## 2014-01-24 NOTE — ED Notes (Signed)
Spoke to pt/doctor/pharmacy concerning nitro drip. Pt is accepting nitro and heparin drip.

## 2014-01-24 NOTE — Progress Notes (Signed)
ANTICOAGULATION CONSULT NOTE - Initial Consult  Pharmacy Consult for Heparin Indication: chest pain/ACS  Allergies  Allergen Reactions  . Antihistamines, Chlorpheniramine-Type Other (See Comments)    Skin crawling, agitation  . Decongestant [Pseudoephedrine] Other (See Comments)    Skin crawling  . Penicillins Rash    Patient Measurements:   Heparin Dosing Weight: 111 kg  Vital Signs: Temp: 98.1 F (36.7 C) (11/13 1732) Temp Source: Oral (11/13 1732) BP: 122/76 mmHg (11/13 1930) Pulse Rate: 76 (11/13 1930)  Labs:  Recent Labs  01/24/14 1732  HGB 12.2*  HCT 37.8*  PLT 297  CREATININE 1.03    Estimated Creatinine Clearance: 115.1 mL/min (by C-G formula based on Cr of 1.03).   Medical History: Past Medical History  Diagnosis Date  . Arthritis   . Depression   . Allergy   . Hypoglycemia   . GERD (gastroesophageal reflux disease)   . PTSD (post-traumatic stress disorder)   . RLS (restless legs syndrome)   . Neuromuscular disorder   . Chronic back pain   . Anxiety     Medications:  Infusions:  . heparin    . heparin    . nitroGLYCERIN      Assessment: 58 yo m who presented to the ED on 11/13 for chest pain.  Pharmacy is consulted to begin heparin for chest pain/ACS.  Hgb 12.2, plts 297, no bleeding noted. [facility].  Goal of Therapy:  Heparin level 0.3-0.7 units/ml Monitor platelets by anticoagulation protocol: Yes   Plan:  Heparin bolus 4,000 units x 1 Heparin infusion at 1500 units/hr 6-hr HL @ 0200 Monitor hgb/plts, s/s of bleeding, clinical course  Cassie L. Roseanne RenoStewart, PharmD Clinical Pharmacy Resident Pager: [phone] 01/24/2014 8:04 PM

## 2014-01-24 NOTE — ED Provider Notes (Signed)
CSN: [id number]     Arrival date & time 01/24/14  1720 History   First MD Initiated Contact with Patient 01/24/14 1741     Chief Complaint  Patient presents with  . Chest Pain  . Shortness of Breath     (Consider location/radiation/quality/duration/timing/severity/associated sxs/prior Treatment) HPI  58 year old male presents from urgent care with chest pain. He's been having chest pain for the past 2 weeks. He states his been intermittent but worsening. Especially over the last 3 weeks she's had more intense pressure and pain. Mostly occurs after exertion but today has been more at rest. Has pain radiating into his neck. For the past 3 days he's been having numbness and tingling going down his left arm and into his fingertips. He notes his been having leg swelling bilaterally for the past 1 month. He's gained 30 pounds. No orthopnea or shortness of breath. Patient is also having left foot and toe tingling and numbness that is been going on for the past 1 month. Has also been feeling dizzy today.  Past Medical History  Diagnosis Date  . Arthritis   . Depression   . Allergy   . Hypoglycemia   . GERD (gastroesophageal reflux disease)   . PTSD (post-traumatic stress disorder)   . RLS (restless legs syndrome)   . Neuromuscular disorder   . Chronic back pain   . Anxiety    Past Surgical History  Procedure Laterality Date  . Cholecystectomy  2000  . Cervical fusion      C4-C6  . Lumbar fusion      L5-S1  . Nissen fundoplication    . Ankle arthroscopy    . Knee arthroscopy    . Hernia repair    . Vasectomy    . Spine surgery      Dr Laymond PurserLeroy Allen ( decease[place]urosur[place] in [place] 58)    Family History  Problem Relation Age of Onset  . Breast cancer Mother   . Cancer Mother   . Stroke Father   . Hypertension Father   . Diabetes Father   . Alzheimer's disease Father   . Alcohol abuse Brother    History  Substance Use Topics  . Smoking status: Never Smoker   .  Smokeless tobacco: Never Used  . Alcohol Use: Yes     Comment: occasional-socially    Review of Systems  Respiratory: Positive for shortness of breath.   Cardiovascular: Positive for chest pain and leg swelling.  Gastrointestinal: Negative for vomiting.  Neurological: Positive for dizziness and numbness. Negative for weakness.  All other systems reviewed and are negative.     Allergies  Antihistamines, chlorpheniramine-type; Decongestant; and Penicillins  Home Medications   Prior to Admission medications   Medication Sig Start Date End Date Taking? Authorizing Provider  diazepam (VALIUM) 10 MG tablet Take 1 tablet (10 mg total) by mouth every 12 (twelve) hours as needed for anxiety. May refill 01/10/14 01/01/14   Thao P Le, DO  fluticasone (FLONASE) 50 MCG/ACT nasal spray Place 2 sprays into both nostrils daily. 11/29/13   Thao P Le, DO  gabapentin (NEURONTIN) 100 MG capsule Take 3 tabs PO qhs for RLS. 01/08/14   Thao P Le, DO  HYDROcodone-acetaminophen (NORCO) 5-325 MG per tablet Take 1 tablet by mouth every 8 (eight) hours as needed for moderate pain. May fill on 01/10/14 01/01/14   Thao P Le, DO  hyoscyamine (LEVSIN, ANASPAZ) 0.125 MG tablet Take 1 tablet (0.125 mg total) by mouth 3 (three) times  daily as needed for cramping. 02/05/13   Lorre Munroe, NP  levothyroxine (SYNTHROID, LEVOTHROID) 100 MCG tablet  12/23/13   Historical Provider, MD  omeprazole (PRILOSEC) 20 MG capsule Take 1 capsule (20 mg total) by mouth daily. 11/29/13   Thao P Le, DO  QUEtiapine (SEROQUEL) 300 MG tablet Take 1 tablet (300 mg total) by mouth at bedtime. 01/01/14   Thao P Le, DO  rOPINIRole (REQUIP) 2 MG tablet Take 1 tablet (2 mg total) by mouth at bedtime. 12/07/13   Thao P Le, DO  sildenafil (VIAGRA) 100 MG tablet Take 0.5 tablets (50 mg total) by mouth daily as needed for erectile dysfunction. Monitor blood pressure, dizziness. 12/30/13   Thao P Le, DO  sucralfate (CARAFATE) 1 G tablet  12/23/13    Historical Provider, MD  traZODone (DESYREL) 50 MG tablet TAKE 1 TABLET (50 MG TOTAL) BY MOUTH AT BEDTIME. 12/26/13   Ethelda Chick, MD   BP 142/88 mmHg  Pulse 84  Temp(Src) 98.1 F (36.7 C) (Oral)  Resp 18  SpO2 99% Physical Exam  Constitutional: He is oriented to person, place, and time. He appears well-developed and well-nourished.  HENT:  Head: Normocephalic and atraumatic.  Right Ear: External ear normal.  Left Ear: External ear normal.  Nose: Nose normal.  Eyes: Right eye exhibits no discharge. Left eye exhibits no discharge.  Neck: Neck supple.  Cardiovascular: Normal rate, regular rhythm, normal heart sounds and intact distal pulses.   Pulmonary/Chest: Effort normal.  Abdominal: Soft. There is no tenderness.  Musculoskeletal: He exhibits edema (bilateral non pitting edema to ankles and feet).  Neurological: He is alert and oriented to person, place, and time.  Skin: Skin is warm and dry.  Nursing note and vitals reviewed.   ED Course  Procedures (including critical care time) Labs Review Labs Reviewed  BASIC METABOLIC PANEL - Abnormal; Notable for the following:    Glucose, Bld 143 (*)    GFR calc non Af Amer 79 (*)    All other components within normal limits  CBC - Abnormal; Notable for the following:    Hemoglobin 12.2 (*)    HCT 37.8 (*)    MCH 25.5 (*)    All other components within normal limits  PRO B NATRIURETIC PEPTIDE  CBC  HEPARIN LEVEL (UNFRACTIONATED)  HEPARIN LEVEL (UNFRACTIONATED)  PRO B NATRIURETIC PEPTIDE  I-STAT TROPOININ, ED    Imaging Review Dg Chest 2 View  01/24/2014   CLINICAL DATA:  Chest pain 3 weeks with shortness of breath vomiting and cough for 2 days initial evaluation, nonsmoker  EXAM: CHEST  2 VIEW  COMPARISON:  None.  FINDINGS: Limited inspiratory effect exaggerating heart size. Allowing for this there is mild to moderate cardiac enlargement. Vascular pattern within normal limits. Lungs clear. No effusion or consolidation.   IMPRESSION: Enlargement of cardiac silhouette.  Otherwise no acute findings.   Electronically Signed   By: Esperanza Heir M.D.   On: 01/24/2014 19:22   Dg Chest 2 View  01/24/2014   CLINICAL DATA:  Two week history of chest pressure and difficulty breathing  EXAM: CHEST  2 VIEW  COMPARISON:  December 04, 2013  FINDINGS: There is no edema or consolidation. The heart is upper normal in size with pulmonary vascularity within normal limits. No adenopathy. No pneumothorax. No bone lesions.  IMPRESSION: No edema or consolidation.   Electronically Signed   By: Bretta Bang M.D.   On: 01/24/2014 13:20   Ct Head Wo  Contrast  01/24/2014   CLINICAL DATA:  Left side numbness, headache, dizziness, chest pain  EXAM: CT HEAD WITHOUT CONTRAST  TECHNIQUE: Contiguous axial images were obtained from the base of the skull through the vertex without intravenous contrast.  COMPARISON:  None.  FINDINGS: No skull fracture is noted. Paranasal sinuses and mastoid air cells are unremarkable.  No intracranial hemorrhage, mass effect or midline shift. No hydrocephalus. The gray and white-matter differentiation is preserved. Mild cerebral atrophy. No intra or extra-axial fluid collection. No acute cortical infarction. No mass lesion is noted on this unenhanced scan.  IMPRESSION: No acute intracranial abnormality.  Mild cerebral atrophy.   Electronically Signed   By: Natasha MeadLiviu  Pop M.D.   On: 01/24/2014 20:03    Date: 01/24/2014  Rate: 79  Rhythm: normal sinus rhythm  QRS Axis: normal  Intervals: normal  ST/T Wave abnormalities: Q waves inferiorly  Conduction Disutrbances:none  Narrative Interpretation:   Old EKG Reviewed: none available   CRITICAL CARE Performed by: Pricilla LovelessGOLDSTON, Willamae Demby T   Total critical care time: 30 minutes  Critical care time was exclusive of separately billable procedures and treating other patients.  Critical care was necessary to treat or prevent imminent or life-threatening  deterioration.  Critical care was time spent personally by me on the following activities: development of treatment plan with patient and/or surrogate as well as nursing, discussions with consultants, evaluation of patient's response to treatment, examination of patient, obtaining history from patient or surrogate, ordering and performing treatments and interventions, ordering and review of laboratory studies, ordering and review of radiographic studies, pulse oximetry and re-evaluation of patient's condition.  MDM   Final diagnoses:  Unstable angina    Patient with worsening intermittent chest pain, initially exertional and now even at rest. Pain is difficult to control with nitroglycerin, but patient is refusing further nitroglycerin due to dizziness. Patient does not have any evidence of ST elevation. EKG shows Q waves but is unknown when he had his MI. Patient having tingling and numbness in the left side of his body but no evidence of stroke on physical exam. I doubt an acute stroke and his symptoms started several days ago. CT was obtained to rule out hemorrhage. Given all of this, will start on heparin, low-dose nitroglycerin infusion, admit to cardiology for unstable angina.    Audree CamelScott T Eliot Bencivenga, MD 01/24/14 [phone]

## 2014-01-24 NOTE — Progress Notes (Addendum)
[facility] [address], [place] [id number] [phone]  Date:  01/24/2014   Name:  Antonio HardingJames Lieder Jr.   DOB:  [birth year]   MRN:  [id number]  PCP:  Dolores Lorylark,Michael Lee, PA-C    Chief Complaint: Heart Issues; Shortness of Breath; and Chest Pain   History of Present Illness:  Antonio HardingJames Devincent Jr. is a 58 y.o. very pleasant male patient who presents with the following:  Difficult historian.  Was [facility] for abdominal pain and an apparent partial small bowel obstruction. Subsequently was [facility] and seen on the cardiology service for a "valve problem."   He comes here complaining of intermittent chest pressure over the past few weeks that seems to be worsening  He has chest pain now in the office that he describes as a tight heaviness radiating into the left arm Pain is associated with shortness of breath with exertion and peripheral edema. He says he cannot take diuretics because the drop his pressure. Denies other complaint or health concern today.   Patient Active Problem List   Diagnosis Date Noted  . Bloating 01/09/2014  . Hiatal hernia 01/09/2014  . Chronic pain syndrome 09/10/2013  . PTSD (post-traumatic stress disorder) 09/10/2013  . Restless leg 09/10/2013  . Anxiety and depression 09/10/2013  . Generalized anxiety disorder 05/31/2012  . Abdominal cramps 05/31/2012  . Chronic back pain 05/31/2012  . Insomnia 05/31/2012  . Nausea alone 05/31/2012    Past Medical History  Diagnosis Date  . Arthritis   . Depression   . Allergy   . Hypoglycemia   . GERD (gastroesophageal reflux disease)   . PTSD (post-traumatic stress disorder)   . RLS (restless legs syndrome)   . Neuromuscular disorder   . Chronic back pain   . Anxiety     Past Surgical History  Procedure Laterality Date  . Cholecystectomy  2000  . Cervical fusion      C4-C6  . Lumbar fusion      L5-S1  . Nissen fundoplication    . Ankle  arthroscopy    . Knee arthroscopy    . Hernia repair    . Vasectomy    . Spine surgery      Dr Laymond PurserLeroy Allen ( [place] 58[place]     History  Substance Use Topics  . Smoking status: Never Smoker   . Smokeless tobacco: Never Used  . Alcohol Use: Yes     Comment: occasional-socially    Family History  Problem Relation Age of Onset  . Breast cancer Mother   . Cancer Mother   . Stroke Father   . Hypertension Father   . Diabetes Father   . Alzheimer's disease Father   . Alcohol abuse Brother     Allergies  Allergen Reactions  . Antihistamines, Chlorpheniramine-Type Other (See Comments)    Skin crawling, agitation  . Decongestant [Pseudoephedrine] Other (See Comments)    Skin crawling  . Penicillins Rash    Medication list has been reviewed and updated.  Current Outpatient Prescriptions on File Prior to Visit  Medication Sig Dispense Refill  . diazepam (VALIUM) 10 MG tablet Take 1 tablet (10 mg total) by mouth every 12 (twelve) hours as needed for anxiety. May refill 01/10/14 60 tablet 0  . fluticasone (FLONASE) 50 MCG/ACT nasal spray Place 2 sprays into both nostrils daily. 16 g 0  . gabapentin (NEURONTIN) 100 MG capsule Take 3 tabs PO qhs for RLS.  90 capsule 0  . HYDROcodone-acetaminophen (NORCO) 5-325 MG per tablet Take 1 tablet by mouth every 8 (eight) hours as needed for moderate pain. May fill on 01/10/14 90 tablet 0  . hyoscyamine (LEVSIN, ANASPAZ) 0.125 MG tablet Take 1 tablet (0.125 mg total) by mouth 3 (three) times daily as needed for cramping. 30 tablet 3  . levothyroxine (SYNTHROID, LEVOTHROID) 100 MCG tablet     . omeprazole (PRILOSEC) 20 MG capsule Take 1 capsule (20 mg total) by mouth daily. 30 capsule 3  . QUEtiapine (SEROQUEL) 300 MG tablet Take 1 tablet (300 mg total) by mouth at bedtime. 30 tablet 5  . rOPINIRole (REQUIP) 2 MG tablet Take 1 tablet (2 mg total) by mouth at bedtime. 6 tablet 0  . sildenafil (VIAGRA) 100 MG tablet Take  0.5 tablets (50 mg total) by mouth daily as needed for erectile dysfunction. Monitor blood pressure, dizziness. 5 tablet 0  . sucralfate (CARAFATE) 1 G tablet     . traZODone (DESYREL) 50 MG tablet TAKE 1 TABLET (50 MG TOTAL) BY MOUTH AT BEDTIME. 30 tablet 0   No current facility-administered medications on file prior to visit.    Review of Systems:  As per HPI, otherwise negative.    Physical Examination: Filed Vitals:   01/24/14 1134  BP: 134/94  Pulse: 85  Temp: 98.7 F (37.1 C)  Resp: 18   Filed Vitals:   01/24/14 1134  Weight: 288 lb 3.2 oz (130.727 kg)   Body mass index is 36.99 kg/(m^2). Ideal Body Weight:    GEN: obese, NAD, Non-toxic, A & O x 3 HEENT: Atraumatic, Normocephalic. Neck supple. No masses, No LAD. Ears and Nose: No external deformity. CV: RRR, No M/G/R. No JVD. No thrill. No extra heart sounds. PULM: CTA B, no wheezes, crackles, rhonchi. No retractions. No resp. distress. No accessory muscle use. ABD: S, NT, ND, +BS. No rebound. No HSM. EXTR: No c/c  Scant pedal edema NEURO Normal gait.  PSYCH: Normally interactive. Conversant. Not depressed or anxious appearing.  Calm demeanor.    Assessment and Plan: Chest pain Congestive failure Unstable angina Advised he be transported by EMS to the ER   Explained risks of not continuing care including death.  He refuses ER transfer  Signed,  Phillips OdorJeffery Anderson, MD   [facility] reading (PRIMARY) by  Dr. Dareen PianoAnderson. negative.

## 2014-01-25 ENCOUNTER — Other Ambulatory Visit: Payer: Self-pay | Admitting: Family Medicine

## 2014-01-25 DIAGNOSIS — I50811 Acute right heart failure: Secondary | ICD-10-CM

## 2014-01-25 DIAGNOSIS — R079 Chest pain, unspecified: Secondary | ICD-10-CM

## 2014-01-25 DIAGNOSIS — G8929 Other chronic pain: Secondary | ICD-10-CM | POA: Diagnosis present

## 2014-01-25 DIAGNOSIS — G2581 Restless legs syndrome: Secondary | ICD-10-CM

## 2014-01-25 DIAGNOSIS — M79606 Pain in leg, unspecified: Secondary | ICD-10-CM

## 2014-01-25 LAB — CBC
HCT: 34.4 % — ABNORMAL LOW (ref 39.0–52.0)
Hemoglobin: 10.9 g/dL — ABNORMAL LOW (ref 13.0–17.0)
MCH: 25.8 pg — ABNORMAL LOW (ref 26.0–34.0)
MCHC: 31.7 g/dL (ref 30.0–36.0)
MCV: 81.3 fL (ref 78.0–100.0)
Platelets: 218 10*3/uL (ref 150–400)
RBC: 4.23 MIL/uL (ref 4.22–5.81)
RDW: 15.5 % (ref 11.5–15.5)
WBC: 4.8 10*3/uL (ref 4.0–10.5)

## 2014-01-25 LAB — LIPID PANEL
CHOLESTEROL: 118 mg/dL (ref 0–200)
HDL: 41 mg/dL (ref >39)
LDL Cholesterol: 66 mg/dL (ref 0–99)
Total CHOL/HDL Ratio: 2.9 RATIO
Triglycerides: 55 mg/dL (ref <150)
VLDL: 11 mg/dL (ref 0–40)

## 2014-01-25 LAB — HEPARIN LEVEL (UNFRACTIONATED)
HEPARIN UNFRACTIONATED: 0.4 [IU]/mL (ref 0.30–0.70)
Heparin Unfractionated: <0.1 IU/mL — ABNORMAL LOW (ref 0.30–0.70)
Heparin Unfractionated: 0.45 IU/mL (ref 0.30–0.70)

## 2014-01-25 LAB — TROPONIN I: Troponin I: <0.3 ng/mL (ref <0.30)

## 2014-01-25 LAB — BASIC METABOLIC PANEL
ANION GAP: 17 — AB (ref 5–15)
BUN: 20 mg/dL (ref 6–23)
CO2: 21 meq/L (ref 19–32)
Calcium: 8.2 mg/dL — ABNORMAL LOW (ref 8.4–10.5)
Chloride: 104 mEq/L (ref 96–112)
Creatinine, Ser: 0.86 mg/dL (ref 0.50–1.35)
GFR calc Af Amer: >90 mL/min (ref >90)
GFR calc non Af Amer: >90 mL/min (ref >90)
Glucose, Bld: 118 mg/dL — ABNORMAL HIGH (ref 70–99)
Potassium: 3.7 mEq/L (ref 3.7–5.3)
SODIUM: 142 meq/L (ref 137–147)

## 2014-01-25 LAB — HEMOGLOBIN A1C
Hgb A1c MFr Bld: 5.9 % — ABNORMAL HIGH (ref <5.7)
MEAN PLASMA GLUCOSE: 123 mg/dL — AB (ref <117)

## 2014-01-25 LAB — MRSA PCR SCREENING: MRSA by PCR: NEGATIVE

## 2014-01-25 LAB — PRO B NATRIURETIC PEPTIDE: PRO B NATRI PEPTIDE: 7.2 pg/mL (ref 0–125)

## 2014-01-25 MED ORDER — HEPARIN BOLUS VIA INFUSION
3000.0000 [IU] | Freq: Once | INTRAVENOUS | Status: AC
  Administered 2014-01-25: 3000 [IU] via INTRAVENOUS
  Filled 2014-01-25: qty 3000

## 2014-01-25 MED ORDER — QUETIAPINE FUMARATE 50 MG PO TABS
300.0000 mg | ORAL_TABLET | Freq: Every day | ORAL | Status: DC
  Administered 2014-01-25 – 2014-01-26 (×3): 300 mg via ORAL
  Filled 2014-01-25 (×3): qty 6
  Filled 2014-01-25: qty 12

## 2014-01-25 MED ORDER — LEVOTHYROXINE SODIUM 100 MCG PO TABS
100.0000 ug | ORAL_TABLET | Freq: Every day | ORAL | Status: DC
  Administered 2014-01-26: 100 ug via ORAL
  Filled 2014-01-25 (×3): qty 1

## 2014-01-25 MED ORDER — ROPINIROLE HCL 1 MG PO TABS
2.0000 mg | ORAL_TABLET | Freq: Two times a day (BID) | ORAL | Status: DC
  Administered 2014-01-25: 2 mg via ORAL

## 2014-01-25 MED ORDER — PANTOPRAZOLE SODIUM 40 MG PO TBEC
40.0000 mg | DELAYED_RELEASE_TABLET | Freq: Every day | ORAL | Status: DC
  Administered 2014-01-25 – 2014-01-27 (×3): 40 mg via ORAL
  Filled 2014-01-25 (×3): qty 1

## 2014-01-25 MED ORDER — NITROGLYCERIN 0.4 MG SL SUBL: 0.4000 mg | SUBLINGUAL_TABLET | SUBLINGUAL | Status: DC | PRN

## 2014-01-25 MED ORDER — ROPINIROLE HCL 1 MG PO TABS
2.0000 mg | ORAL_TABLET | Freq: Two times a day (BID) | ORAL | Status: DC
  Administered 2014-01-25 – 2014-01-27 (×4): 2 mg via ORAL
  Filled 2014-01-25 (×4): qty 2

## 2014-01-25 MED ORDER — FUROSEMIDE 10 MG/ML IJ SOLN
40.0000 mg | Freq: Two times a day (BID) | INTRAMUSCULAR | Status: DC
  Filled 2014-01-25: qty 4

## 2014-01-25 MED ORDER — ASPIRIN EC 81 MG PO TBEC
81.0000 mg | DELAYED_RELEASE_TABLET | Freq: Every day | ORAL | Status: DC
  Administered 2014-01-25 – 2014-01-26 (×2): 81 mg via ORAL
  Filled 2014-01-25 (×3): qty 1

## 2014-01-25 MED ORDER — TRAZODONE HCL 50 MG PO TABS: 50.0000 mg | ORAL_TABLET | Freq: Every evening | ORAL | Status: DC | PRN

## 2014-01-25 MED ORDER — FUROSEMIDE 10 MG/ML IJ SOLN: 40.0000 mg | Freq: Two times a day (BID) | INTRAMUSCULAR | Status: DC

## 2014-01-25 MED ORDER — GABAPENTIN 300 MG PO CAPS
300.0000 mg | ORAL_CAPSULE | Freq: Three times a day (TID) | ORAL | Status: DC
  Administered 2014-01-25 – 2014-01-27 (×7): 300 mg via ORAL
  Filled 2014-01-25 (×7): qty 1

## 2014-01-25 MED ORDER — HYDROCODONE-ACETAMINOPHEN 5-325 MG PO TABS
1.0000 | ORAL_TABLET | Freq: Three times a day (TID) | ORAL | Status: DC
  Administered 2014-01-25 – 2014-01-27 (×7): 1 via ORAL
  Filled 2014-01-25 (×7): qty 1

## 2014-01-25 MED ORDER — ATORVASTATIN CALCIUM 40 MG PO TABS
40.0000 mg | ORAL_TABLET | Freq: Every day | ORAL | Status: DC
  Administered 2014-01-25 – 2014-01-27 (×3): 40 mg via ORAL
  Filled 2014-01-25 (×3): qty 1

## 2014-01-25 MED ORDER — ACETAMINOPHEN 325 MG PO TABS
650.0000 mg | ORAL_TABLET | ORAL | Status: DC | PRN
Start: 2014-01-25 — End: 2014-01-27

## 2014-01-25 MED ORDER — ONDANSETRON HCL 4 MG/2ML IJ SOLN
4.0000 mg | Freq: Four times a day (QID) | INTRAMUSCULAR | Status: DC | PRN
  Administered 2014-01-26: 4 mg via INTRAVENOUS
  Filled 2014-01-25: qty 2

## 2014-01-25 MED ORDER — FUROSEMIDE 10 MG/ML IJ SOLN
40.0000 mg | Freq: Two times a day (BID) | INTRAMUSCULAR | Status: DC
  Administered 2014-01-25 – 2014-01-26 (×3): 40 mg via INTRAVENOUS
  Filled 2014-01-25 (×2): qty 4

## 2014-01-25 NOTE — Plan of Care (Signed)
Problem: Consults Goal: Chest Pain Patient Education (See Patient Education module for education specifics.) Outcome: Completed/Met Date Met:  01/25/14

## 2014-01-25 NOTE — Progress Notes (Addendum)
ANTICOAGULATION CONSULT NOTE - Follow Up Consult  Pharmacy Consult for Heparin Indication: chest pain/ACS  Allergies  Allergen Reactions  . Antihistamines, Chlorpheniramine-Type Other (See Comments)    Skin crawling, agitation  . Decongestant [Pseudoephedrine] Other (See Comments)    Skin crawling  . Penicillins Rash    Patient Measurements: Height: 6\' 2"  (188 cm) Weight: 278 lb 14.4 oz (126.508 kg) IBW/kg (Calculated) : 82.2 Heparin Dosing Weight: 110 kg  Vital Signs: Temp: 97.6 F (36.4 C) (11/14 0801) Temp Source: Oral (11/14 0801) BP: 112/70 mmHg (11/14 0801) Pulse Rate: 80 (11/14 0801)  Labs:  Recent Labs  01/24/14 1732 01/24/14 2255 01/25/14 0422 01/25/14 0423 01/25/14 1136 01/25/14 1145  HGB 12.2*  --  10.9*  --   --   --   HCT 37.8*  --  34.4*  --   --   --   PLT 297  --  218  --   --   --   HEPARINUNFRC  --   --   --  <0.10*  --  0.45  CREATININE 1.03  --  0.86  --   --   --   TROPONINI  --  <0.30 <0.30  --  <0.30  --     Estimated Creatinine Clearance: 135.5 mL/min (by C-G formula based on Cr of 0.86).   Assessment: 58 yoM on heparin for chest pain. HL 0.45 at goal on 1950 units/hr. Hgb and platelets are stable. No bleeding has been noted. Will continue at current rate and recheck a confirmatory 6-hour heparin level. If at goal, will plan to check daily heparin levels.  Goal of Therapy:  Heparin level 0.3-0.7 units/ml Monitor platelets by anticoagulation protocol: Yes   Plan:  1) Continue Heparin 1950 units/hr 2) Recheck 6 hour HL @ 1900 3) If still at goal, check daily HL, CBC 4) Monitor hgb/plts, s/s of bleeding, clinical course  Russ HaloAshley McCallister, PharmD Clinical Pharmacist - Resident Pager: [phone] 11/14/20151:33 PM   01/25/2014 8:12 PM Confirmatory heparin level at goal.  No bleeding noted.  Continue current therapy at 1950 units/hr check heparin level in am.  Kayman Snuffer Merrily Brittlehristine Bates Kholton Coate

## 2014-01-25 NOTE — ED Notes (Signed)
Attempted report 

## 2014-01-25 NOTE — Progress Notes (Signed)
ANTICOAGULATION CONSULT NOTE Pharmacy Consult for Heparin Indication: chest pain/ACS  Allergies  Allergen Reactions  . Antihistamines, Chlorpheniramine-Type Other (See Comments)    Skin crawling, agitation  . Decongestant [Pseudoephedrine] Other (See Comments)    Skin crawling  . Penicillins Rash    Patient Measurements: Heparin Dosing Weight: 111 kg  Vital Signs: Temp: 97.4 F (36.3 C) (11/14 0150) Temp Source: Oral (11/14 0150) BP: 131/81 mmHg (11/14 0150) Pulse Rate: 68 (11/14 0150)  Labs:  Recent Labs  01/24/14 1732 01/24/14 2255 01/25/14 0422 01/25/14 0423  HGB 12.2*  --   --   --   HCT 37.8*  --   --   --   PLT 297  --   --   --   HEPARINUNFRC  --   --   --  <0.10*  CREATININE 1.03  --  0.86  --   TROPONINI  --  <0.30 <0.30  --     Estimated Creatinine Clearance: 137.8 mL/min (by C-G formula based on Cr of 0.86).  Assessment: 58 y.o. male with chest pain for heparin   Goal of Therapy:  Heparin level 0.3-0.7 units/ml Monitor platelets by anticoagulation protocol: Yes   Plan:  Heparin 3000 units IV bolus, then increase heparin 1950 units/hr Check heparin level in 6 hours.  Geannie RisenGreg Faylene Allerton, PharmD, BCPS

## 2014-01-25 NOTE — Plan of Care (Signed)
Problem: Phase I Progression Outcomes Goal: Anginal pain relieved Outcome: Progressing     

## 2014-01-25 NOTE — Progress Notes (Addendum)
SUBJECTIVE:  No complaints of chest pain today.  His main complaint is chronic pain in his legs and restless legs from not getting his normal dose of requip and neurontin and Norco  OBJECTIVE:   Vitals:   Filed Vitals:   01/25/14 0107 01/25/14 0150 01/25/14 0510 01/25/14 0801  BP: 100/61 131/81 105/71 112/70  Pulse: 72 68 79 80  Temp: 98 F (36.7 C) 97.4 F (36.3 C) 97.7 F (36.5 C) 97.6 F (36.4 C)  TempSrc: Oral Oral Oral Oral  Resp:   18 18  Height:   6\' 2"  (1.88 m)   Weight:   278 lb 14.4 oz (126.508 kg)   SpO2: 97% 97% 95% 95%   I&O's:   Intake/Output Summary (Last 24 hours) at 01/25/14 1014 Last data filed at 01/25/14 0700  Gross per 24 hour  Intake    496 ml  Output   2900 ml  Net  -2404 ml   TELEMETRY: Reviewed telemetry pt in NSR:     PHYSICAL EXAM General: Well developed, well nourished, in no acute distress Head: Eyes PERRLA, No xanthomas.   Normal cephalic and atramatic  Lungs:   Clear bilaterally to auscultation and percussion. Heart:   HRRR S1 S2 Pulses are 2+ & equal.. Abdomen: Bowel sounds are positive, abdomen soft and non-tender without masses Extremities:   No clubbing, cyanosis or edema.  DP +1 Neuro: Alert and oriented X 3. Psych:  Good affect, responds appropriately   LABS: Basic Metabolic Panel:  Recent Labs  81/19/14 1732 01/25/14 0422  NA 141 142  K 3.7 3.7  CL 104 104  CO2 22 21  GLUCOSE 143* 118*  BUN 18 20  CREATININE 1.03 0.86  CALCIUM 9.1 8.2*   Liver Function Tests: No results for input(s): AST, ALT, ALKPHOS, BILITOT, PROT, ALBUMIN in the last 72 hours. No results for input(s): LIPASE, AMYLASE in the last 72 hours. CBC:  Recent Labs  01/24/14 1732 01/25/14 0422  WBC 7.0 4.8  HGB 12.2* 10.9*  HCT 37.8* 34.4*  MCV 79.1 81.3  PLT 297 218   Cardiac Enzymes:  Recent Labs  01/24/14 2255 01/25/14 0422  TROPONINI <0.30 <0.30   BNP: Invalid input(s): POCBNP D-Dimer: No results for input(s): DDIMER in the  last 72 hours. Hemoglobin A1C: No results for input(s): HGBA1C in the last 72 hours. Fasting Lipid Panel:  Recent Labs  01/25/14 0422  CHOL 118  HDL 41  LDLCALC 66  TRIG 55  CHOLHDL 2.9   Thyroid Function Tests: No results for input(s): TSH, T4TOTAL, T3FREE, THYROIDAB in the last 72 hours.  Invalid input(s): FREET3 Anemia Panel: No results for input(s): VITAMINB12, FOLATE, FERRITIN, TIBC, IRON, RETICCTPCT in the last 72 hours. Coag Panel:   No results found for: INR, PROTIME  RADIOLOGY: Dg Chest 2 View  01/24/2014   CLINICAL DATA:  Chest pain 3 weeks with shortness of breath vomiting and cough for 2 days initial evaluation, nonsmoker  EXAM: CHEST  2 VIEW  COMPARISON:  None.  FINDINGS: Limited inspiratory effect exaggerating heart size. Allowing for this there is mild to moderate cardiac enlargement. Vascular pattern within normal limits. Lungs clear. No effusion or consolidation.  IMPRESSION: Enlargement of cardiac silhouette.  Otherwise no acute findings.   Electronically Signed   By: Esperanza Heir M.D.   On: 01/24/2014 19:22   Dg Chest 2 View  01/24/2014   CLINICAL DATA:  Two week history of chest pressure and difficulty breathing  EXAM: CHEST  2  VIEW  COMPARISON:  December 04, 2013  FINDINGS: There is no edema or consolidation. The heart is upper normal in size with pulmonary vascularity within normal limits. No adenopathy. No pneumothorax. No bone lesions.  IMPRESSION: No edema or consolidation.   Electronically Signed   By: Bretta BangWilliam  Woodruff M.D.   On: 01/24/2014 13:20   Dg Abd 1 View  12/30/2013   CLINICAL DATA:  Constipation and nausea.  EXAM: ABDOMEN - 1 VIEW  COMPARISON:  None.  FINDINGS: The bowel gas pattern is normal. No radio-opaque calculi or other significant radiographic abnormality are seen.  IMPRESSION: Negative.   Electronically Signed   By: Drusilla Kannerhomas  Dalessio M.D.   On: 12/30/2013 12:20   Ct Head Wo Contrast  01/24/2014   CLINICAL DATA:  Left side numbness,  headache, dizziness, chest pain  EXAM: CT HEAD WITHOUT CONTRAST  TECHNIQUE: Contiguous axial images were obtained from the base of the skull through the vertex without intravenous contrast.  COMPARISON:  None.  FINDINGS: No skull fracture is noted. Paranasal sinuses and mastoid air cells are unremarkable.  No intracranial hemorrhage, mass effect or midline shift. No hydrocephalus. The gray and white-matter differentiation is preserved. Mild cerebral atrophy. No intra or extra-axial fluid collection. No acute cortical infarction. No mass lesion is noted on this unenhanced scan.  IMPRESSION: No acute intracranial abnormality.  Mild cerebral atrophy.   Electronically Signed   By: Natasha MeadLiviu  Pop M.D.   On: 01/24/2014 20:03   Dg Foot Complete Left  12/30/2013   CLINICAL DATA:  Initial encounter for left fifth toe injury.  EXAM: LEFT FOOT - COMPLETE 3+ VIEW  COMPARISON:  No comparison studies available.  FINDINGS: No evidence for fracture. No subluxation or dislocation. Degenerative changes are seen in the MTP joint of the great toe. No worrisome lytic or sclerotic osseous abnormality.  IMPRESSION: Negative.   Electronically Signed   By: Kennith CenterEric  Mansell M.D.   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 Also notes multiple somatic complaints including left-sided tingling in different appendages. Head CT performed.  1. 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 Cardiac enzymes are negative.  With ECG abnormality, signs of heart failure, chest pain, he will likely  need LHC on Monday.  Continue ASA/statin/IV Heparin gtt/NTG gtt.  2. Left-sided paresthesias-difficult to fully characterize. He does have a history of neuromuscular disorder, chronic back pain, anxiety as well as posttraumatic stress disorder. Perhaps this is playing a role. Head CT showed no acute intracranial abnormality. He does not appear to be having an acute stroke.  3. Acute heart failure-right-sided symptoms with increasing edema noted.  He has put out 2.4L thus far.  Continue IV furosemide 40 mg twice a day. We will check echocardiogram. Check TSH.  4. Morbid obesity-weight is 288 pounds. Encourage weight loss, decrease carbohydrates.  5. PTSD-continue with his anti-anxiety medications.  6. Elevated glucose-143 nonfasting. Check hemoglobin A1c.  7.  Restless legs syndrome - will place back on his normal regimen of requip  8.  Chronic leg pain - will start back on home dose of neurontin and Norco  TURNER,TRACI  R, MD  01/25/2014  10:14 AM

## 2014-01-26 DIAGNOSIS — R072 Precordial pain: Secondary | ICD-10-CM

## 2014-01-26 DIAGNOSIS — M79602 Pain in left arm: Secondary | ICD-10-CM

## 2014-01-26 DIAGNOSIS — R079 Chest pain, unspecified: Secondary | ICD-10-CM

## 2014-01-26 DIAGNOSIS — M79605 Pain in left leg: Secondary | ICD-10-CM

## 2014-01-26 LAB — CBC
HCT: 38.1 % — ABNORMAL LOW (ref 39.0–52.0)
HCT: 40.2 % (ref 39.0–52.0)
HEMOGLOBIN: 12.7 g/dL — AB (ref 13.0–17.0)
Hemoglobin: 12.1 g/dL — ABNORMAL LOW (ref 13.0–17.0)
MCH: 25.9 pg — ABNORMAL LOW (ref 26.0–34.0)
MCH: 25.9 pg — AB (ref 26.0–34.0)
MCHC: 31.8 g/dL (ref 30.0–36.0)
MCHC: 31.6 g/dL (ref 30.0–36.0)
MCV: 81.6 fL (ref 78.0–100.0)
MCV: 82 fL (ref 78.0–100.0)
Platelets: 255 10*3/uL (ref 150–400)
Platelets: 263 10*3/uL (ref 150–400)
RBC: 4.67 MIL/uL (ref 4.22–5.81)
RBC: 4.9 MIL/uL (ref 4.22–5.81)
RDW: 15.6 % — AB (ref 11.5–15.5)
RDW: 15.4 % (ref 11.5–15.5)
WBC: 5.6 10*3/uL (ref 4.0–10.5)
WBC: 6.1 10*3/uL (ref 4.0–10.5)

## 2014-01-26 LAB — BASIC METABOLIC PANEL
Anion gap: 13 (ref 5–15)
BUN: 18 mg/dL (ref 6–23)
CALCIUM: 9 mg/dL (ref 8.4–10.5)
CO2: 27 meq/L (ref 19–32)
CREATININE: 1.14 mg/dL (ref 0.50–1.35)
Chloride: 99 mEq/L (ref 96–112)
GFR calc Af Amer: 81 mL/min — ABNORMAL LOW (ref >90)
GFR calc non Af Amer: 70 mL/min — ABNORMAL LOW (ref >90)
Glucose, Bld: 115 mg/dL — ABNORMAL HIGH (ref 70–99)
Potassium: 3.7 mEq/L (ref 3.7–5.3)
Sodium: 139 mEq/L (ref 137–147)

## 2014-01-26 LAB — PLATELET INHIBITION P2Y12: PLATELET FUNCTION P2Y12: 286 [PRU] (ref 194–418)

## 2014-01-26 LAB — PROTIME-INR
INR: 1.13 (ref 0.00–1.49)
Prothrombin Time: 14.6 seconds (ref 11.6–15.2)

## 2014-01-26 LAB — TROPONIN I: Troponin I: <0.3 ng/mL (ref <0.30)

## 2014-01-26 LAB — HEPARIN LEVEL (UNFRACTIONATED)
HEPARIN UNFRACTIONATED: 0.46 [IU]/mL (ref 0.30–0.70)
Heparin Unfractionated: 0.73 IU/mL — ABNORMAL HIGH (ref 0.30–0.70)
Heparin Unfractionated: 0.77 IU/mL — ABNORMAL HIGH (ref 0.30–0.70)

## 2014-01-26 MED ORDER — SODIUM CHLORIDE 0.9 % IJ SOLN
3.0000 mL | INTRAMUSCULAR | Status: DC | PRN
Start: 2014-01-26 — End: 2014-01-27

## 2014-01-26 MED ORDER — MORPHINE SULFATE 2 MG/ML IJ SOLN
2.0000 mg | INTRAMUSCULAR | Status: AC
  Administered 2014-01-26: 2 mg via INTRAVENOUS
  Filled 2014-01-26: qty 1

## 2014-01-26 MED ORDER — SODIUM CHLORIDE 0.9 % IV SOLN: 250.0000 mL | INTRAVENOUS | Status: DC | PRN

## 2014-01-26 MED ORDER — SODIUM CHLORIDE 0.9 % IV SOLN
INTRAVENOUS | Status: DC
  Administered 2014-01-27: 08:00:00 via INTRAVENOUS

## 2014-01-26 MED ORDER — FUROSEMIDE 40 MG PO TABS
40.0000 mg | ORAL_TABLET | Freq: Two times a day (BID) | ORAL | Status: DC
  Administered 2014-01-26: 40 mg via ORAL
  Filled 2014-01-26 (×2): qty 1

## 2014-01-26 MED ORDER — ASPIRIN 81 MG PO CHEW
81.0000 mg | CHEWABLE_TABLET | ORAL | Status: AC
  Administered 2014-01-27: 81 mg via ORAL
  Filled 2014-01-26: qty 1

## 2014-01-26 MED ORDER — SODIUM CHLORIDE 0.9 % IJ SOLN
3.0000 mL | Freq: Two times a day (BID) | INTRAMUSCULAR | Status: DC
  Administered 2014-01-26 – 2014-01-27 (×2): 3 mL via INTRAVENOUS

## 2014-01-26 MED ORDER — HEPARIN (PORCINE) IN NACL 100-0.45 UNIT/ML-% IJ SOLN
1600.0000 [IU]/h | INTRAMUSCULAR | Status: DC
  Administered 2014-01-26 – 2014-01-27 (×2): 1600 [IU]/h via INTRAVENOUS
  Filled 2014-01-26: qty 250

## 2014-01-26 NOTE — Progress Notes (Addendum)
SUBJECTIVE:  Denies any chest pain or SOB  OBJECTIVE:   Vitals:   Filed Vitals:   01/25/14 2000 01/26/14 0000 01/26/14 0116 01/26/14 0400  BP: 110/77 93/57 105/67 94/65  Pulse: 93 87  72  Temp: 98.5 F (36.9 C) 98.6 F (37 C)  98.3 F (36.8 C)  TempSrc:      Resp: 19 15 20 16   Height:      Weight:    281 lb (127.461 kg)  SpO2: 99% 99%  94%   I&O's:   Intake/Output Summary (Last 24 hours) at 01/26/14 1022 Last data filed at 01/26/14 [id number]  Gross per 24 hour  Intake   1120 ml  Output   2950 ml  Net  -1830 ml   TELEMETRY: Reviewed telemetry pt in NSR:     PHYSICAL EXAM General: Well developed, well nourished, in no acute distress Head: Eyes PERRLA, No xanthomas.   Normal cephalic and atramatic  Lungs:   Clear bilaterally to auscultation and percussion. Heart:   HRRR S1 S2 Pulses are 2+ & equal. Abdomen: Bowel sounds are positive, abdomen soft and non-tender without masses  Extremities:   No clubbing, cyanosis or edema.  DP +1 Neuro: Alert and oriented X 3. Psych:  Good affect, responds appropriately   LABS: Basic Metabolic Panel:  Recent Labs  54/11/8109/13/15 1732 01/25/14 0422  NA 141 142  K 3.7 3.7  CL 104 104  CO2 22 21  GLUCOSE 143* 118*  BUN 18 20  CREATININE 1.03 0.86  CALCIUM 9.1 8.2*   Liver Function Tests: No results for input(s): AST, ALT, ALKPHOS, BILITOT, PROT, ALBUMIN in the last 72 hours. No results for input(s): LIPASE, AMYLASE in the last 72 hours. CBC:  Recent Labs  01/25/14 0422 01/26/14 0415  WBC 4.8 5.6  HGB 10.9* 12.1*  HCT 34.4* 38.1*  MCV 81.3 81.6  PLT 218 255   Cardiac Enzymes:  Recent Labs  01/24/14 2255 01/25/14 0422 01/25/14 1136  TROPONINI <0.30 <0.30 <0.30   BNP: Invalid input(s): POCBNP D-Dimer: No results for input(s): DDIMER in the last 72 hours. Hemoglobin A1C:  Recent Labs  01/25/14 0422  HGBA1C 5.9*   Fasting Lipid Panel:  Recent Labs  01/25/14 0422  CHOL 118  HDL 41  LDLCALC 66  TRIG 55    CHOLHDL 2.9   Thyroid Function Tests: No results for input(s): TSH, T4TOTAL, T3FREE, THYROIDAB in the last 72 hours.  Invalid input(s): FREET3 Anemia Panel: No results for input(s): VITAMINB12, FOLATE, FERRITIN, TIBC, IRON, RETICCTPCT in the last 72 hours. Coag Panel:   No results found for: INR, PROTIME  RADIOLOGY: Dg Chest 2 View  01/24/2014   CLINICAL DATA:  Chest pain 3 weeks with shortness of breath vomiting and cough for 2 days initial evaluation, nonsmoker  EXAM: CHEST  2 VIEW  COMPARISON:  None.  FINDINGS: Limited inspiratory effect exaggerating heart size. Allowing for this there is mild to moderate cardiac enlargement. Vascular pattern within normal limits. Lungs clear. No effusion or consolidation.  IMPRESSION: Enlargement of cardiac silhouette.  Otherwise no acute findings.   Electronically Signed   By: Esperanza Heiraymond  Rubner M.D.   On: 01/24/2014 19:22   Dg Chest 2 View  01/24/2014   CLINICAL DATA:  Two week history of chest pressure and difficulty breathing  EXAM: CHEST  2 VIEW  COMPARISON:  December 04, 2013  FINDINGS: There is no edema or consolidation. The heart is upper normal in size with pulmonary vascularity within normal limits.  No adenopathy. No pneumothorax. No bone lesions.  IMPRESSION: No edema or consolidation.   Electronically Signed   By: Bretta Bang M.D.   On: 01/24/2014 13:20   Dg Abd 1 View  12/30/2013   CLINICAL DATA:  Constipation and nausea.  EXAM: ABDOMEN - 1 VIEW  COMPARISON:  None.  FINDINGS: The bowel gas pattern is normal. No radio-opaque calculi or other significant radiographic abnormality are seen.  IMPRESSION: Negative.   Electronically Signed   By: Drusilla Kanner M.D.   On: 12/30/2013 12:20   Ct Head Wo Contrast  01/24/2014   CLINICAL DATA:  Left side numbness, headache, dizziness, chest pain  EXAM: CT HEAD WITHOUT CONTRAST  TECHNIQUE: Contiguous axial images were obtained from the base of the skull through the vertex without intravenous  contrast.  COMPARISON:  None.  FINDINGS: No skull fracture is noted. Paranasal sinuses and mastoid air cells are unremarkable.  No intracranial hemorrhage, mass effect or midline shift. No hydrocephalus. The gray and white-matter differentiation is preserved. Mild cerebral atrophy. No intra or extra-axial fluid collection. No acute cortical infarction. No mass lesion is noted on this unenhanced scan.  IMPRESSION: No acute intracranial abnormality.  Mild cerebral atrophy.   Electronically Signed   By: Natasha Mead M.D.   On: 01/24/2014 20:03   Dg Foot Complete Left  12/30/2013   CLINICAL DATA:  Initial encounter for left fifth toe injury.  EXAM: LEFT FOOT - COMPLETE 3+ VIEW  COMPARISON:  No comparison studies available.  FINDINGS: No evidence for fracture. No subluxation or dislocation. Degenerative changes are seen in the MTP joint of the great toe. No worrisome lytic or sclerotic osseous abnormality.  IMPRESSION: Negative.   Electronically Signed   By: Kennith Center M.D.   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 Also notes multiple somatic complaints including left-sided tingling in different appendages. Head CT performed.  1. 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 Cardiac enzymes are negative. With ECG abnormality, signs of heart failure, chest pain, will plan LHC on Monday. Continue ASA/statin/IV Heparin gtt/NTG gtt.  Cardiac catheterization was discussed with the patient fully including risks on myocardial infarction, death, stroke,  bleeding, arrhythmia, dye allergy, renal insufficiency or bleeding.  All patient questions and concerns were discussed and the patient understands and is willing to proceed.    2. Left-sided paresthesias-difficult to fully characterize. He does have a history of neuromuscular disorder, chronic back pain, anxiety as well as posttraumatic stress disorder. Perhaps this is playing a role. Head CT showed no acute intracranial abnormality. He does not appear to be having an acute stroke.  3. Acute heart failure-right-sided symptoms with increasing edema noted. He has put out 4.2L thus far. Weight increased despite good diuresis ? Accuracy.  His LE edema has resolved and lungs are clear.  Change IV to PO furosemide 40 mg twice a day. We will check echocardiogram. Check TSH.  4. Morbid obesity-weight is 288 pounds. Encourage weight loss, decrease carbohydrates.  5. PTSD-continue with his anti-anxiety medications.  6. Elevated glucose-143 nonfasting. hemoglobin A1c 5.9  7. Restless legs syndrome -  Continue  requip  8. Chronic leg pain - Continue neurontin and Kayleen MemosNorco   TURNER,TRACI R, MD  01/26/2014  10:22 AM

## 2014-01-26 NOTE — Progress Notes (Signed)
  Called by RN.  Rennis HardingJames Ragan Jr. returned to his room from seeing his wife who is also in the hospital.  They apparently had an argument and now he is having chest pain. He told the RN that he did not want NTG gtt increased due to HA.   I advised her to get an ECG now, give Morphine 2 mg IV x 1 (repeat in 2 hours if needed), cycle Troponin x 3 (q 6 hrs). Tereso NewcomerScott Elsye Mccollister, PA-C   01/26/2014 12:52 PM

## 2014-01-26 NOTE — Progress Notes (Signed)
ANTICOAGULATION CONSULT NOTE - Follow Up Consult  Pharmacy Consult for Heparin  Indication: chest pain/ACS  Vital Signs: Temp: 98.6 F (37 C) (11/15 0000) BP: 105/67 mmHg (11/15 0116) Pulse Rate: 87 (11/15 0000)  Labs:  Recent Labs  01/24/14 1732 01/24/14 2255 01/25/14 0422  01/25/14 1136 01/25/14 1145 01/25/14 1900 01/26/14 0415  HGB 12.2*  --  10.9*  --   --   --   --  12.1*  HCT 37.8*  --  34.4*  --   --   --   --  38.1*  PLT 297  --  218  --   --   --   --  255  HEPARINUNFRC  --   --   --   < >  --  0.45 0.40 0.73*  CREATININE 1.03  --  0.86  --   --   --   --   --   TROPONINI  --  <0.30 <0.30  --  <0.30  --   --   --   < > = values in this interval not displayed.  Assessment: Slightly SUPRA-therapeutic heparin level, other labs as above, no issues per RN.   Goal of Therapy:  Heparin level 0.3-0.7 units/ml Monitor platelets by anticoagulation protocol: Yes   Plan:  -Decrease heparin drip to 1800 units/hr -1300 HL -Daily CBC/HL -Monitor for bleeding  Abran DukeLedford, Aj 01/26/2014,5:25 AM

## 2014-01-26 NOTE — Progress Notes (Signed)
  Echocardiogram 2D Echocardiogram has been performed.  Aris EvertsRix, Armstead Heiland A 01/26/2014, 3:57 PM

## 2014-01-26 NOTE — Progress Notes (Signed)
ANTICOAGULATION CONSULT NOTE - Follow Up Consult  Pharmacy Consult for Heparin  Indication: chest pain/ACS  Vital Signs:    Labs:  Recent Labs  01/24/14 1732  01/25/14 0422  01/25/14 1136  01/26/14 0415 01/26/14 1308 01/26/14 1320 01/26/14 1656  HGB 12.2*  --  10.9*  --   --   --  12.1*  --   --  12.7*  HCT 37.8*  --  34.4*  --   --   --  38.1*  --   --  40.2  PLT 297  --  218  --   --   --  255  --   --  263  HEPARINUNFRC  --   --   --   < >  --   < > 0.73*  --  0.46 0.77*  CREATININE 1.03  --  0.86  --   --   --   --   --   --   --   TROPONINI  --   < > <0.30  --  <0.30  --   --  <0.30  --   --   < > = values in this interval not displayed.  Assessment: Heparin level has increased from 0.46 to 0.77, now slightly SUPRA-therapeutic heparin level on 1800 units/hr. No bleeding noted. RN reports level was drawn correctly from arm opposite of one that heparin is running.   Goal of Therapy:  Heparin level 0.3-0.7 units/ml Monitor platelets by anticoagulation protocol: Yes   Plan:  -Decrease heparin drip to 1600 units/hr 6hr heparin level -Daily CBC/HL -Monitor for bleeding  Noah Delaineuth Kyrollos Cordell, RPh Clinical Pharmacist Pager: [phone] 01/26/2014,5:42 PM

## 2014-01-26 NOTE — Progress Notes (Signed)
ANTICOAGULATION CONSULT NOTE - Follow Up Consult  Pharmacy Consult for Heparin Indication: chest pain/ACS  Allergies  Allergen Reactions  . Antihistamines, Chlorpheniramine-Type Other (See Comments)    Skin crawling, agitation  . Decongestant [Pseudoephedrine] Other (See Comments)    Skin crawling  . Penicillins Rash    Patient Measurements: Height: 6\' 2"  (188 cm) Weight: 281 lb (127.461 kg) IBW/kg (Calculated) : 82.2 Heparin Dosing Weight: 110 kg  Vital Signs: Temp: 98.3 F (36.8 C) (11/15 0400) BP: 94/65 mmHg (11/15 0400) Pulse Rate: 72 (11/15 0400)  Labs:  Recent Labs  01/24/14 1732  01/25/14 0422  01/25/14 1136  01/25/14 1900 01/26/14 0415 01/26/14 1308 01/26/14 1320  HGB 12.2*  --  10.9*  --   --   --   --  12.1*  --   --   HCT 37.8*  --  34.4*  --   --   --   --  38.1*  --   --   PLT 297  --  218  --   --   --   --  255  --   --   HEPARINUNFRC  --   --   --   < >  --   < > 0.40 0.73*  --  0.46  CREATININE 1.03  --  0.86  --   --   --   --   --   --   --   TROPONINI  --   < > <0.30  --  <0.30  --   --   --  <0.30  --   < > = values in this interval not displayed.  Estimated Creatinine Clearance: 136.1 mL/min (by C-G formula based on Cr of 0.86).   Assessment: 58 yoM on heparin for chest pain. HL 0.46 at goal on 1800 units/hr.  Hemoglobin and platelets are stable. No bleeding has been noted. Will continue at current rate and check a confirmatory 6-hour heparin level.   Goal of Therapy:  Heparin level 0.3-0.7 units/ml Monitor platelets by anticoagulation protocol: Yes   Plan:  1) Continue Heparin 1800 units/hr 2) Check 6 hour confirmatory HL @ 1700 3) If still at goal, check daily HL, CBC 4) Monitor hgb/plts, s/s of bleeding, clinical course  Russ HaloAshley Brexlee Heberlein, PharmD Clinical Pharmacist - Resident Pager: [phone] 11/15/20152:17 PM

## 2014-01-27 ENCOUNTER — Other Ambulatory Visit: Payer: Self-pay | Admitting: Physician Assistant

## 2014-01-27 ENCOUNTER — Encounter (HOSPITAL_COMMUNITY)
Admission: EM | Disposition: A | Discharge status: Home / Self Care | Payer: Self-pay | Source: Home / Self Care | Attending: Cardiology

## 2014-01-27 DIAGNOSIS — I50811 Acute right heart failure: Secondary | ICD-10-CM

## 2014-01-27 DIAGNOSIS — F418 Other specified anxiety disorders: Secondary | ICD-10-CM

## 2014-01-27 HISTORY — PX: LEFT AND RIGHT HEART CATHETERIZATION WITH CORONARY ANGIOGRAM: SHX5449

## 2014-01-27 LAB — CBC
HEMATOCRIT: 37.9 % — AB (ref 39.0–52.0)
Hemoglobin: 12.1 g/dL — ABNORMAL LOW (ref 13.0–17.0)
MCH: 26 pg (ref 26.0–34.0)
MCHC: 31.9 g/dL (ref 30.0–36.0)
MCV: 81.5 fL (ref 78.0–100.0)
Platelets: 259 10*3/uL (ref 150–400)
RBC: 4.65 MIL/uL (ref 4.22–5.81)
RDW: 15.3 % (ref 11.5–15.5)
WBC: 5.5 10*3/uL (ref 4.0–10.5)

## 2014-01-27 LAB — TROPONIN I: Troponin I: <0.3 ng/mL (ref <0.30)

## 2014-01-27 LAB — HEPARIN LEVEL (UNFRACTIONATED)
Heparin Unfractionated: 0.58 IU/mL (ref 0.30–0.70)
Heparin Unfractionated: 0.62 IU/mL (ref 0.30–0.70)

## 2014-01-27 LAB — POCT ACTIVATED CLOTTING TIME: ACTIVATED CLOTTING TIME: 90 s

## 2014-01-27 SURGERY — LEFT AND RIGHT HEART CATHETERIZATION WITH CORONARY ANGIOGRAM
Anesthesia: LOCAL

## 2014-01-27 MED ORDER — NITROGLYCERIN 1 MG/10 ML FOR IR/CATH LAB
INTRA_ARTERIAL | Status: AC
  Filled 2014-01-27: qty 10

## 2014-01-27 MED ORDER — HEPARIN (PORCINE) IN NACL 2-0.9 UNIT/ML-% IJ SOLN
INTRAMUSCULAR | Status: AC
  Filled 2014-01-27: qty 1500

## 2014-01-27 MED ORDER — MIDAZOLAM HCL 2 MG/2ML IJ SOLN
INTRAMUSCULAR | Status: AC
  Filled 2014-01-27: qty 2

## 2014-01-27 MED ORDER — SODIUM CHLORIDE 0.9 % IV SOLN
INTRAVENOUS | Status: DC
  Administered 2014-01-27: 1000 mL via INTRAVENOUS

## 2014-01-27 MED ORDER — ACETAMINOPHEN 325 MG PO TABS: 650.0000 mg | ORAL_TABLET | ORAL | Status: DC | PRN

## 2014-01-27 MED ORDER — LIDOCAINE HCL (PF) 1 % IJ SOLN
INTRAMUSCULAR | Status: AC
  Filled 2014-01-27: qty 30

## 2014-01-27 MED ORDER — TRAMADOL HCL 50 MG PO TABS
50.0000 mg | ORAL_TABLET | Freq: Once | ORAL | Status: AC
  Administered 2014-01-27: 50 mg via ORAL
  Filled 2014-01-27: qty 1

## 2014-01-27 MED ORDER — ROPINIROLE HCL 1 MG PO TABS
2.0000 mg | ORAL_TABLET | Freq: Two times a day (BID) | ORAL | Status: DC
  Administered 2014-01-27: 2 mg via ORAL
  Filled 2014-01-27: qty 2

## 2014-01-27 MED ORDER — FUROSEMIDE 40 MG PO TABS
40.0000 mg | ORAL_TABLET | Freq: Two times a day (BID) | ORAL | Status: DC
Start: 2014-01-27 — End: 2014-03-11

## 2014-01-27 MED ORDER — ONDANSETRON HCL 4 MG/2ML IJ SOLN: 4.0000 mg | Freq: Four times a day (QID) | INTRAMUSCULAR | Status: DC | PRN

## 2014-01-27 MED ORDER — ASPIRIN EC 81 MG PO TBEC
81.0000 mg | DELAYED_RELEASE_TABLET | Freq: Every day | ORAL | Status: DC
  Filled 2014-01-27: qty 1

## 2014-01-27 MED ORDER — FENTANYL CITRATE 0.05 MG/ML IJ SOLN
INTRAMUSCULAR | Status: AC
  Filled 2014-01-27: qty 2

## 2014-01-27 NOTE — Plan of Care (Signed)
Problem: Consults Goal: Skin Care Protocol Initiated - if Braden Score 18 or less If consults are not indicated, leave blank or document N/A  Outcome: Completed/Met Date Met:  01/27/14 Goal: Tobacco Cessation referral if indicated Outcome: Not Applicable Date Met:  37/54/36 Goal: Nutrition Consult-if indicated Outcome: Completed/Met Date Met:  01/27/14 Goal: Diabetes Guidelines if Diabetic/Glucose > 140 If diabetic or lab glucose is > 140 mg/dl - Initiate Diabetes/Hyperglycemia Guidelines & Document Interventions  Outcome: Not Applicable Date Met:  06/77/03

## 2014-01-27 NOTE — Interval H&P Note (Signed)
Cath Lab Visit (complete for each Cath Lab visit)  Clinical Evaluation Leading to the Procedure:   ACS: No.  Non-ACS:    Anginal Classification: CCS III  Anti-ischemic medical therapy: Maximal Therapy (2 or more classes of medications)  Non-Invasive Test Results: No non-invasive testing performed  Prior CABG: No previous CABG      History and Physical Interval Note:  01/27/2014 1:06 PM  Antonio HardingJames Cullipher Jr.  has presented today for surgery, with the diagnosis of unstable angina and right sided CHF  The various methods of treatment have been discussed with the patient and family. After consideration of risks, benefits and other options for treatment, the patient has consented to  Procedure(s): LEFT AND RIGHT HEART CATHETERIZATION WITH CORONARY ANGIOGRAM (N/A) as a surgical intervention .  The patient's history has been reviewed, patient examined, no change in status, stable for surgery.  I have reviewed the patient's chart and labs.  Questions were answered to the patient's satisfaction.     [name]

## 2014-01-27 NOTE — Progress Notes (Signed)
Patient Name: Antonio HardingJames Mccomb Jr. Date of Encounter: 01/27/2014     Principal Problem:   Unstable angina Active Problems:   Acute right heart failure   Restless legs syndrome   Chronic leg pain   Chest pain    SUBJECTIVE  Intermittent CP since 3 am, denies any significant SOB. States his LE edema has significantly improved.  CURRENT MEDS . aspirin EC  81 mg Oral Daily  . atorvastatin  40 mg Oral q1800  . furosemide  40 mg Oral BID  . gabapentin  300 mg Oral TID  . HYDROcodone-acetaminophen  1 tablet Oral 3 times per day  . levothyroxine  100 mcg Oral QAC breakfast  . pantoprazole  40 mg Oral Daily  . QUEtiapine  300 mg Oral QHS  . rOPINIRole  2 mg Oral BID  . sodium chloride  3 mL Intravenous Q12H    OBJECTIVE  Filed Vitals:   01/26/14 1406 01/26/14 2000 01/27/14 0000 01/27/14 0500  BP: 101/70 114/66 122/65 120/73  Pulse: 91 80 81 83  Temp:  98.2 F (36.8 C) 98.5 F (36.9 C) 98.1 F (36.7 C)  TempSrc:  Oral Oral Oral  Resp: 14 18 18    Height:      Weight:    285 lb 9.6 oz (129.547 kg)  SpO2: 99% 96% 96% 96%    Intake/Output Summary (Last 24 hours) at 01/27/14 0743 Last data filed at 01/27/14 0351  Gross per 24 hour  Intake   1153 ml  Output   3125 ml  Net  -1972 ml   Filed Weights   01/25/14 0510 01/26/14 0400 01/27/14 0500  Weight: 278 lb 14.4 oz (126.508 kg) 281 lb (127.461 kg) 285 lb 9.6 oz (129.547 kg)    PHYSICAL EXAM  General: Pleasant, NAD. Neuro: Alert and oriented X 3. Moves all extremities spontaneously. Psych: Normal affect. HEENT:  Normal  Neck: Supple without bruits or JVD. Lungs:  Resp regular and unlabored, CTA. Heart: RRR no s3, s4, or murmurs. Abdomen: Soft, non-tender, non-distended, BS + x 4.  Extremities: No clubbing, cyanosis or edema. DP/PT/Radials 2+ and equal bilaterally.  Accessory Clinical Findings  CBC  Recent Labs  01/26/14 1656 01/27/14 0252  WBC 6.1 5.5  HGB 12.7* 12.1*  HCT 40.2 37.9*  MCV 82.0 81.5    PLT 263 259   Basic Metabolic Panel  Recent Labs  01/25/14 0422 01/26/14 1656  NA 142 139  K 3.7 3.7  CL 104 99  CO2 21 27  GLUCOSE 118* 115*  BUN 20 18  CREATININE 0.86 1.14  CALCIUM 8.2* 9.0   Cardiac Enzymes  Recent Labs  01/26/14 1308 01/26/14 1656 01/27/14 0010  TROPONINI <0.30 <0.30 <0.30   Hemoglobin A1C  Recent Labs  01/25/14 0422  HGBA1C 5.9*   Fasting Lipid Panel  Recent Labs  01/25/14 0422  CHOL 118  HDL 41  LDLCALC 66  TRIG 55  CHOLHDL 2.9    TELE NSR with HR 70-90s, occasional tachycardic in 110s    ECG  NSR with q wave in inferior lead  Echocardiogram 01/26/2014  LV EF: 50% -  55%  ------------------------------------------------------------------- Indications:   Chest pain 786.51.  ------------------------------------------------------------------- History:  PMH: unstable angina, acute right heart failure, chest pain, and GERD.  ------------------------------------------------------------------- Study Conclusions  - Left ventricle: Cannot rule out focal distal septal akinesis. The cavity size was normal. There was moderate focal basal hypertrophy. Systolic function was normal. The estimated ejection fraction was in the range of  50% to 55%. Wall motion was normal; there were no regional wall motion abnormalities. There was an increased relative contribution of atrial contraction to ventricular filling. Doppler parameters are consistent with abnormal left ventricular relaxation (grade 1 diastolic dysfunction). - Ascending aorta: The ascending aorta was mildly dilated. - Right ventricle: Systolic function was mildly reduced.    Radiology/Studies  Dg Chest 2 View  01/24/2014   CLINICAL DATA:  Chest pain 3 weeks with shortness of breath vomiting and cough for 2 days initial evaluation, nonsmoker  EXAM: CHEST  2 VIEW  COMPARISON:  None.  FINDINGS: Limited inspiratory effect exaggerating heart size.  Allowing for this there is mild to moderate cardiac enlargement. Vascular pattern within normal limits. Lungs clear. No effusion or consolidation.  IMPRESSION: Enlargement of cardiac silhouette.  Otherwise no acute findings.   Electronically Signed   By: Esperanza Heir M.D.   On: 01/24/2014 19:22   Dg Chest 2 View  01/24/2014   CLINICAL DATA:  Two week history of chest pressure and difficulty breathing  EXAM: CHEST  2 VIEW  COMPARISON:  December 04, 2013  FINDINGS: There is no edema or consolidation. The heart is upper normal in size with pulmonary vascularity within normal limits. No adenopathy. No pneumothorax. No bone lesions.  IMPRESSION: No edema or consolidation.   Electronically Signed   By: Bretta Bang M.D.   On: 01/24/2014 13:20   Dg Abd 1 View  12/30/2013   CLINICAL DATA:  Constipation and nausea.  EXAM: ABDOMEN - 1 VIEW  COMPARISON:  None.  FINDINGS: The bowel gas pattern is normal. No radio-opaque calculi or other significant radiographic abnormality are seen.  IMPRESSION: Negative.   Electronically Signed   By: Drusilla Kanner M.D.   On: 12/30/2013 12:20   Ct Head Wo Contrast  01/24/2014   CLINICAL DATA:  Left side numbness, headache, dizziness, chest pain  EXAM: CT HEAD WITHOUT CONTRAST  TECHNIQUE: Contiguous axial images were obtained from the base of the skull through the vertex without intravenous contrast.  COMPARISON:  None.  FINDINGS: No skull fracture is noted. Paranasal sinuses and mastoid air cells are unremarkable.  No intracranial hemorrhage, mass effect or midline shift. No hydrocephalus. The gray and white-matter differentiation is preserved. Mild cerebral atrophy. No intra or extra-axial fluid collection. No acute cortical infarction. No mass lesion is noted on this unenhanced scan.  IMPRESSION: No acute intracranial abnormality.  Mild cerebral atrophy.   Electronically Signed   By: Natasha Mead M.D.   On: 01/24/2014 20:03   Dg Foot Complete Left  12/30/2013    CLINICAL DATA:  Initial encounter for left fifth toe injury.  EXAM: LEFT FOOT - COMPLETE 3+ VIEW  COMPARISON:  No comparison studies available.  FINDINGS: No evidence for fracture. No subluxation or dislocation. Degenerative changes are seen in the MTP joint of the great toe. No worrisome lytic or sclerotic osseous abnormality.  IMPRESSION: Negative.   Electronically Signed   By: Kennith Center M.D.   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 Also notes multiple somatic complaints including left-sided tingling in different appendages. Head CT performed.  1. Unstable angina  - 2 wks intermittent CP, DOE and edema  - pending LHC today. Risk and benefit was discussed with the pt  who is willing to proceed with the procedure  - if cath negative, likely discharge later today to followup with his GI doctor.   2. Left-sided paresthesias-difficult to fully characterize. Possible etiology include neuromuscular disorder, chronic back pain, anxiety as well as posttraumatic stress disorder. Head CT showed no acute intracranial abnormality. He does not appear to be having an acute stroke.  3. Acute heart failure  - s/p good diuresis. -5.6L. Continue PO furosemide 40 mg twice a day.   - Echo 01/26/2014 EF 50-55%, grade 1 diastolic dysfunction, no RWMA, mildly reduced RVEF  - will need 1 wk BMET after discharge, if Cr trending up, will need to decrease lasix to 40mg  daily.  4. Morbid obesity-weight is 288 pounds. Encourage weight loss, decrease carbohydrates.  5. PTSD-continue with his anti-anxiety medications.  6. Elevated glucose-143 nonfasting. hemoglobin A1c 5.9  7. Restless legs syndrome - Continue requip  8. Chronic leg pain - Continue  neurontin and Norco   Signed, Amedeo PlentyMeng, Hao PA-C Pager: [phone] Patient seen.  Agree with above assessment and plan. Patient is very anxious. Upset that he has to wait until noon for his cath. Anxious for discharge this afternoon if no stent is required. [place].

## 2014-01-27 NOTE — Care Management Note (Signed)
    Page 1 of 1   01/27/2014     10:55:41 AM CARE MANAGEMENT NOTE 01/27/2014  Patient:  Antonio MillardOSBORNE,Antonio   Account Number:  [id number]  Date Initiated:  01/27/2014  Documentation initiated by:  GRAVES-BIGELOW,Krisalyn Yankowski  Subjective/Objective Assessment:   Pt admitted for cp. Plan for cath today.     Action/Plan:   No needs identified from CM. Will continue to monitor.   Anticipated DC Date:  01/27/2014   Anticipated DC Plan:  HOME/SELF CARE      DC Planning Services  CM consult      Choice offered to / List presented to:             Status of service:  Completed, signed off Medicare Important Message given?  NO (If response is "NO", the following Medicare IM given date fields will be blank) Date Medicare IM given:   Medicare IM given by:   Date Additional Medicare IM given:   Additional Medicare IM given by:    Discharge Disposition:  HOME/SELF CARE  Per UR Regulation:  Reviewed for med. necessity/level of care/duration of stay  If discussed at Long Length of Stay Meetings, dates discussed:    Comments:

## 2014-01-27 NOTE — H&P (View-Only) (Signed)
 Patient Name: Antonio Dansereau Jr. Date of Encounter: 01/27/2014     Principal Problem:   Unstable angina Active Problems:   Acute right heart failure   Restless legs syndrome   Chronic leg pain   Chest pain    SUBJECTIVE  Intermittent CP since 3 am, denies any significant SOB. States his LE edema has significantly improved.  CURRENT MEDS . aspirin EC  81 mg Oral Daily  . atorvastatin  40 mg Oral q1800  . furosemide  40 mg Oral BID  . gabapentin  300 mg Oral TID  . HYDROcodone-acetaminophen  1 tablet Oral 3 times per day  . levothyroxine  100 mcg Oral QAC breakfast  . pantoprazole  40 mg Oral Daily  . QUEtiapine  300 mg Oral QHS  . rOPINIRole  2 mg Oral BID  . sodium chloride  3 mL Intravenous Q12H    OBJECTIVE  Filed Vitals:   01/26/14 1406 01/26/14 2000 01/27/14 0000 01/27/14 0500  BP: 101/70 114/66 122/65 120/73  Pulse: 91 80 81 83  Temp:  98.2 F (36.8 C) 98.5 F (36.9 C) 98.1 F (36.7 C)  TempSrc:  Oral Oral Oral  Resp: 14 18 18   Height:      Weight:    285 lb 9.6 oz (129.547 kg)  SpO2: 99% 96% 96% 96%    Intake/Output Summary (Last 24 hours) at 01/27/14 0743 Last data filed at 01/27/14 0351  Gross per 24 hour  Intake   1153 ml  Output   3125 ml  Net  -1972 ml   Filed Weights   01/25/14 0510 01/26/14 0400 01/27/14 0500  Weight: 278 lb 14.4 oz (126.508 kg) 281 lb (127.461 kg) 285 lb 9.6 oz (129.547 kg)    PHYSICAL EXAM  General: Pleasant, NAD. Neuro: Alert and oriented X 3. Moves all extremities spontaneously. Psych: Normal affect. HEENT:  Normal  Neck: Supple without bruits or JVD. Lungs:  Resp regular and unlabored, CTA. Heart: RRR no s3, s4, or murmurs. Abdomen: Soft, non-tender, non-distended, BS + x 4.  Extremities: No clubbing, cyanosis or edema. DP/PT/Radials 2+ and equal bilaterally.  Accessory Clinical Findings  CBC  Recent Labs  01/26/14 1656 01/27/14 0252  WBC 6.1 5.5  HGB 12.7* 12.1*  HCT 40.2 37.9*  MCV 82.0 81.5    PLT 263 259   Basic Metabolic Panel  Recent Labs  01/25/14 0422 01/26/14 1656  NA 142 139  K 3.7 3.7  CL 104 99  CO2 21 27  GLUCOSE 118* 115*  BUN 20 18  CREATININE 0.86 1.14  CALCIUM 8.2* 9.0   Cardiac Enzymes  Recent Labs  01/26/14 1308 01/26/14 1656 01/27/14 0010  TROPONINI <0.30 <0.30 <0.30   Hemoglobin A1C  Recent Labs  01/25/14 0422  HGBA1C 5.9*   Fasting Lipid Panel  Recent Labs  01/25/14 0422  CHOL 118  HDL 41  LDLCALC 66  TRIG 55  CHOLHDL 2.9    TELE NSR with HR 70-90s, occasional tachycardic in 110s    ECG  NSR with q wave in inferior lead  Echocardiogram 01/26/2014  LV EF: 50% -  55%  ------------------------------------------------------------------- Indications:   Chest pain 786.51.  ------------------------------------------------------------------- History:  PMH: unstable angina, acute right heart failure, chest pain, and GERD.  ------------------------------------------------------------------- Study Conclusions  - Left ventricle: Cannot rule out focal distal septal akinesis. The cavity size was normal. There was moderate focal basal hypertrophy. Systolic function was normal. The estimated ejection fraction was in the range of   50% to 55%. Wall motion was normal; there were no regional wall motion abnormalities. There was an increased relative contribution of atrial contraction to ventricular filling. Doppler parameters are consistent with abnormal left ventricular relaxation (grade 1 diastolic dysfunction). - Ascending aorta: The ascending aorta was mildly dilated. - Right ventricle: Systolic function was mildly reduced.    Radiology/Studies  Dg Chest 2 View  01/24/2014   CLINICAL DATA:  Chest pain 3 weeks with shortness of breath vomiting and cough for 2 days initial evaluation, nonsmoker  EXAM: CHEST  2 VIEW  COMPARISON:  None.  FINDINGS: Limited inspiratory effect exaggerating heart size.  Allowing for this there is mild to moderate cardiac enlargement. Vascular pattern within normal limits. Lungs clear. No effusion or consolidation.  IMPRESSION: Enlargement of cardiac silhouette.  Otherwise no acute findings.   Electronically Signed   By: Raymond  Rubner M.D.   On: 01/24/2014 19:22   Dg Chest 2 View  01/24/2014   CLINICAL DATA:  Two week history of chest pressure and difficulty breathing  EXAM: CHEST  2 VIEW  COMPARISON:  December 04, 2013  FINDINGS: There is no edema or consolidation. The heart is upper normal in size with pulmonary vascularity within normal limits. No adenopathy. No pneumothorax. No bone lesions.  IMPRESSION: No edema or consolidation.   Electronically Signed   By: William  Woodruff M.D.   On: 01/24/2014 13:20   Dg Abd 1 View  12/30/2013   CLINICAL DATA:  Constipation and nausea.  EXAM: ABDOMEN - 1 VIEW  COMPARISON:  None.  FINDINGS: The bowel gas pattern is normal. No radio-opaque calculi or other significant radiographic abnormality are seen.  IMPRESSION: Negative.   Electronically Signed   By: Thayne Cindric  Dalessio M.D.   On: 12/30/2013 12:20   Ct Head Wo Contrast  01/24/2014   CLINICAL DATA:  Left side numbness, headache, dizziness, chest pain  EXAM: CT HEAD WITHOUT CONTRAST  TECHNIQUE: Contiguous axial images were obtained from the base of the skull through the vertex without intravenous contrast.  COMPARISON:  None.  FINDINGS: No skull fracture is noted. Paranasal sinuses and mastoid air cells are unremarkable.  No intracranial hemorrhage, mass effect or midline shift. No hydrocephalus. The gray and white-matter differentiation is preserved. Mild cerebral atrophy. No intra or extra-axial fluid collection. No acute cortical infarction. No mass lesion is noted on this unenhanced scan.  IMPRESSION: No acute intracranial abnormality.  Mild cerebral atrophy.   Electronically Signed   By: Liviu  Pop M.D.   On: 01/24/2014 20:03   Dg Foot Complete Left  12/30/2013    CLINICAL DATA:  Initial encounter for left fifth toe injury.  EXAM: LEFT FOOT - COMPLETE 3+ VIEW  COMPARISON:  No comparison studies available.  FINDINGS: No evidence for fracture. No subluxation or dislocation. Degenerative changes are seen in the MTP joint of the great toe. No worrisome lytic or sclerotic osseous abnormality.  IMPRESSION: Negative.   Electronically Signed   By: Eric  Mansell M.D.   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 Also notes multiple somatic complaints including left-sided tingling in different appendages. Head CT performed.  1. Unstable angina  - 2 wks intermittent CP, DOE and edema  - pending LHC today. Risk and benefit was discussed with the pt   who is willing to proceed with the procedure  - if cath negative, likely discharge later today to followup with his GI doctor.   2. Left-sided paresthesias-difficult to fully characterize. Possible etiology include neuromuscular disorder, chronic back pain, anxiety as well as posttraumatic stress disorder. Head CT showed no acute intracranial abnormality. He does not appear to be having an acute stroke.  3. Acute heart failure  - s/p good diuresis. -5.6L. Continue PO furosemide 40 mg twice a day.   - Echo 01/26/2014 EF 50-55%, grade 1 diastolic dysfunction, no RWMA, mildly reduced RVEF  - will need 1 wk BMET after discharge, if Cr trending up, will need to decrease lasix to 40mg daily.  4. Morbid obesity-weight is 288 pounds. Encourage weight loss, decrease carbohydrates.  5. PTSD-continue with his anti-anxiety medications.  6. Elevated glucose-143 nonfasting. hemoglobin A1c 5.9  7. Restless legs syndrome - Continue requip  8. Chronic leg pain - Continue  neurontin and Norco   Signed, Meng, Hao PA-C Pager: [phone] Patient seen.  Agree with above assessment and plan. Patient is very anxious. Upset that he has to wait until noon for his cath. Anxious for discharge this afternoon if no stent is required. Followup with his GI doctor in [place] if cath negative. 

## 2014-01-27 NOTE — CV Procedure (Addendum)
  Antonio HardingJames Broughton Jr. is a 58 y.o. male   [id number]  [id number] LOCATION:  FACILITY: [facility]  PHYSICIAN: Lennette Biharihomas A. Kelly, MD, [facility] 18-Jan-1956   DATE OF PROCEDURE:  01/27/2014      RIGHT AND LEFT HEART CARDIAC CATHETERIZATION   HISTORY:   Antonio HardingJames Abad Jr. is a 58 y.o. male who was admitted to the hospital with a two-week history of intermittent chest discomfort, dyspnea on exertion and edema.  His ECG suggested the possibility of an old inferior MI.  He has undergone significant diuresis.  He was felt to have right heart failure.  He is now referred for right and left heart cardiac catheterization.   PROCEDURE:  Right and left heart catheterization: Swan-Ganz catheterization with cardiac output determinations by thermodilution and Fick method, coronary angiography, left ventriculography.  The patient was brought to the second floor [place] cath lab in the postabsorptive state. Versed 2 mg and fentanyl 50 mcg were administered for conscious sedation. The right groin was prepped and draped in sterile fashion and a 5 [place] arterial sheath and 7 French venous sheath were inserted without difficulty. A Swan-Ganz catheter was advanced into the venous sheath and pressures were obtained in the right atrium, right ventricle, pulmonary artery, and pulmonary capillary wedge position. Cardiac outputs were obtained by the thermodilution and assumed Fick methods. Oxygen saturation was obtained in the pulmonary artery and aorta. A pigtail catheter was inserted and simultaneous AO/PA pressures were recorded. The pigtail catheter was advanced into the left ventricle and simultaneous left ventricular and PCW pressures were recorded. Left ventriculography was performed in the RAO projection.  A left ventricle to aorta pullback was performed. The pigtail catheter was then removed and diagnostic catheterization to delineate the coronary anatomy was performed utilizing 5 French Judkins 4 left and right  diagnostic catheters. All catheters were removed and the patient. Hemostasis was obtained by direct manual pressure. The patient tolerated the procedure well and returned to his room in satisfactory condition.   HEMODYNAMICS:   RA: mean 11 RV: 28/9 PA: 28/14 PC: mean 12 LV: 114/76  AO: 114/76  Oxygen saturation in the aorta  71% and the pulmonary artery  94%  Cardiac output: 7.5 l/min (Thermo); 8.8 (Fick)  Cardiac index: 3.0 l/m/m2                3.5  ANGIOGRAPHY:   Left main: Angiographically normal and bifurcated into the LAD and left circumflex coronary arteries  LAD: Angiographically normal and gave rise to 2 diagonal branches and one septal perforating artery.  The vessel extended to the LV apex.  Left circumflex:  Large dominant left circumflex vessel which gave rise to 2 marginal vessels and ended in the PDA and PLA vessel.  Right coronary artery:  Angiographically normal small nondominant RCA  Left ventriculography revealed low normal LV function with an ejection fraction of 50-55% without focal segmental wall motion abnormalities.  There was no evidence for mitral regurgitation.   IMPRESSION:  Normal coronary arteries.  Low normal LV function with an ejection fraction of 50-55%.  Upper normal right heart pressures.  RECOMMENDATION:  Consider noncardiac etiology to the patient's chest pain.    Lennette Biharihomas A. Kelly, MD, [facility] CountyFACC 01/27/2014 2:11 PM

## 2014-01-27 NOTE — Plan of Care (Signed)
Problem: Phase II Progression Outcomes Goal: Hemodynamically stable Outcome: Completed/Met Date Met:  01/27/14 Goal: Anginal pain relieved Outcome: Completed/Met Date Met:  01/27/14 Goal: Stress Test if indicated Outcome: Not Applicable Date Met:  38/88/28 Goal: Cath/PCI Day Path if indicated Outcome: Completed/Met Date Met:  01/27/14 Goal: CV Risk Factors identified Outcome: Completed/Met Date Met:  01/27/14 Goal: Cardiac Rehab if ordered Outcome: Not Applicable Date Met:  00/34/91 Goal: If positive for MI, change to MI Path Outcome: Not Applicable Date Met:  79/15/05 Goal: Other Phase II Outcomes/Goals Outcome: Completed/Met Date Met:  01/27/14  Problem: Phase III Progression Outcomes Goal: Hemodynamically stable Outcome: Completed/Met Date Met:  01/27/14 Goal: No anginal pain Outcome: Completed/Met Date Met:  01/27/14 Goal: Cath/PCI Path as indicated Outcome: Completed/Met Date Met:  01/27/14 Goal: Vascular site scale level 0 - I Vascular Site Scale Level 0: No bruising/bleeding/hematoma Level I (Mild): Bruising/Ecchymosis, minimal bleeding/ooozing, palpable hematoma < 3 cm Level II (Moderate): Bleeding not affecting hemodynamic parameters, pseudoaneurysm, palpable hematoma > 3 cm Level III (Severe) Bleeding which affects hemodynamic parameters or retroperitoneal hemorrhage  Outcome: Completed/Met Date Met:  01/27/14 Goal: Discharge plan remains appropriate-arrangements made Outcome: Completed/Met Date Met:  01/27/14 Goal: Tolerating diet Outcome: Completed/Met Date Met:  01/27/14 Goal: If positive for MI, change to MI Path Outcome: Not Applicable Date Met:  69/79/48 Goal: Other Phase III Outcomes/Goals Outcome: Completed/Met Date Met:  01/27/14  Problem: Discharge Progression Outcomes Goal: No anginal pain Outcome: Completed/Met Date Met:  01/27/14 Goal: Hemodynamically stable Outcome: Completed/Met Date Met:  01/65/53 Goal: Complications  resolved/controlled Outcome: Completed/Met Date Met:  01/27/14 Goal: Barriers To Progression Addressed/Resolved Outcome: Completed/Met Date Met:  01/27/14 Goal: Discharge plan in place and appropriate Outcome: Completed/Met Date Met:  01/27/14 Goal: Vascular site scale level 0 - I Vascular Site Scale Level 0: No bruising/bleeding/hematoma Level I (Mild): Bruising/Ecchymosis, minimal bleeding/ooozing, palpable hematoma < 3 cm Level II (Moderate): Bleeding not affecting hemodynamic parameters, pseudoaneurysm, palpable hematoma > 3 cm Level III (Severe) Bleeding which affects hemodynamic parameters or retroperitoneal hemorrhage  Outcome: Completed/Met Date Met:  01/27/14 Goal: Tolerates diet Outcome: Completed/Met Date Met:  01/27/14 Goal: Activity appropriate for discharge plan Outcome: Completed/Met Date Met:  01/27/14 Goal: Other Discharge Outcomes/Goals Outcome: Completed/Met Date Met:  01/27/14

## 2014-01-27 NOTE — Discharge Summary (Signed)
Discharge Summary   Patient ID: Antonio Ungaro.,  MRN: [id number], DOB/AGE: 56-Jun-1957 56 y.o.  Admit date: 01/24/2014 Discharge date: 01/27/2014  Primary Care Provider: LE,TRI H Primary Cardiologist: New - Dr. Caro Hight  Discharge Diagnoses Principal Problem:   Chest pain - negative cath 01/27/2014 Active Problems:   Abdominal cramps   Hiatal hernia   Acute right heart failure   Restless legs syndrome   Chronic leg pain   Allergies Allergies  Allergen Reactions  . Antihistamines, Chlorpheniramine-Type Other (See Comments)    Skin crawling, agitation  . Decongestant [Pseudoephedrine] Other (See Comments)    Skin crawling  . Penicillins Rash    Procedures  Echocardiogram 11/15 LV EF: 50% -  55%  ------------------------------------------------------------------- Indications:   Chest pain 786.51.  ------------------------------------------------------------------- History:  PMH: unstable angina, acute right heart failure, chest pain, and GERD.  ------------------------------------------------------------------- Study Conclusions  - Left ventricle: Cannot rule out focal distal septal akinesis. The cavity size was normal. There was moderate focal basal hypertrophy. Systolic function was normal. The estimated ejection fraction was in the range of 50% to 55%. Wall motion was normal; there were no regional wall motion abnormalities. There was an increased relative contribution of atrial contraction to ventricular filling. Doppler parameters are consistent with abnormal left ventricular relaxation (grade 1 diastolic dysfunction). - Ascending aorta: The ascending aorta was mildly dilated. - Right ventricle: Systolic function was mildly reduced.    Cardiac catheterization 11/16 PROCEDURE: Right and left heart catheterization: Swan-Ganz catheterization with cardiac output determinations by thermodilution and Fick method, coronary angiography,  left ventriculography.  HEMODYNAMICS:   RA: mean 11 RV: 28/9 PA: 28/14 PC: mean 12 LV: 114/76  AO: 114/76  Oxygen saturation in the aorta 71% and the pulmonary artery 94%  Cardiac output: 7.5 l/min (Thermo); 8.8 (Fick)  Cardiac index: 3.0 l/m/m2 3.5  ANGIOGRAPHY:   Left main: Angiographically normal and bifurcated into the LAD and left circumflex coronary arteries  LAD: Angiographically normal and gave rise to 2 diagonal branches and one septal perforating artery. The vessel extended to the LV apex.  Left circumflex: Large dominant left circumflex vessel which gave rise to 2 marginal vessels and ended in the PDA and PLA vessel.  Right coronary artery: Angiographically normal small nondominant RCA  Left ventriculography revealed low normal LV function with an ejection fraction of 50-55% without focal segmental wall motion abnormalities. There was no evidence for mitral regurgitation.   IMPRESSION:  Normal coronary arteries.  Low normal LV function with an ejection fraction of 50-55%.  Upper normal right heart pressures.  RECOMMENDATION:  Consider noncardiac etiology to the patient's chest pain.    Hospital Course  The patient is a 58 year old male with history of anxiety/depression, GERD, and hiatal hernia however no prior cardiac history who presented to [facility] on 01/24/2014 with multiple complaints including 2 weeks of intermittent mid chest pain, dyspnea on exertion and edema. He went to urgent care as well as [facility] recently for hiatal hernia and was told he had an abnormal EKG. His chest pain radiated to his left jaw and shoulder and would be associated with some nausea. He also reports recent 30 pounds weight gain.   He was admitted to cardiology service. Serial troponin was negative. Initial EKG showed sinus rhythm with Q-wave in the inferior lead concerning for prior infarct as well as T-wave inversion in the  lateral leads and poor R-wave progression in anterior lead. Given his EKG changes, his symptom was concerning for unstable  angina. He was placed on IV nitroglycerin and IV heparin. As discussion treatment plans, risk and benefit, it was decided for the patient to undergo cardiac catheterization to definitively assess coronary anatomy. Since, he had some right-sided heart failure symptom with increasing edema, he was diuresed with 40 mg twice a day IV Lasix. Echocardiogram was obtained on 01/26/2014 which showed EF 50-55%, grade 1 diastolic dysfunction, mildly reduced RV EF, mildly dilated descending aorta. He underwent left and right heart cath on 01/27/2014. Right heart cath showed normal CI/CO. RV pressure 28/9. Left heart cath showed EF 50-55% with normal coronaries. It was felt the patient's chest pain is likely noncardiac. He is deemed stable for discharge from cardiology perspective. He will need to follow-up with his PCP and GI doctor for further evaluation of noncardiac source of chest pain.  Of note, during this hospitalization, given his right sized heart failure sign, he was placed on 40 mg twice a day of PO Lasix. He will need one week BMET, if creatinine trend up, he will need potentially scale back on the Lasix to 40 mg daily. I will schedule follow-up with Dr. Judd GaudierSkain's office in 2-4 weeks.   Discharge Vitals Blood pressure 105/49, pulse 70, temperature 98.4 F (36.9 C), temperature source Oral, resp. rate 14, height 6\' 2"  (1.88 m), weight 285 lb 9.6 oz (129.547 kg), SpO2 97 %.  Filed Weights   01/25/14 0510 01/26/14 0400 01/27/14 0500  Weight: 278 lb 14.4 oz (126.508 kg) 281 lb (127.461 kg) 285 lb 9.6 oz (129.547 kg)    Labs  CBC  Recent Labs  01/26/14 1656 01/27/14 0252  WBC 6.1 5.5  HGB 12.7* 12.1*  HCT 40.2 37.9*  MCV 82.0 81.5  PLT 263 259   Basic Metabolic Panel  Recent Labs  01/25/14 0422 01/26/14 1656  NA 142 139  K 3.7 3.7  CL 104 99  CO2 21 27  GLUCOSE 118*  115*  BUN 20 18  CREATININE 0.86 1.14  CALCIUM 8.2* 9.0   Cardiac Enzymes  Recent Labs  01/26/14 1308 01/26/14 1656 01/27/14 0010  TROPONINI <0.30 <0.30 <0.30   Hemoglobin A1C  Recent Labs  01/25/14 0422  HGBA1C 5.9*   Fasting Lipid Panel  Recent Labs  01/25/14 0422  CHOL 118  HDL 41  LDLCALC 66  TRIG 55  CHOLHDL 2.9    Disposition  Pt is being discharged home today in good condition.  Follow-up Plans & Appointments      Follow-up Information    [facility] [address] [facility].   Specialty:  Cardiology   Why:  1 week BMET lab during normal lab hour between 7:30am and 5pm   Contact information:   [address], [address] [place] [id number] [phone]      Follow up with Tereso NewcomerScott Weaver, PA-C On 02/21/2014.   Specialty:  Physician Assistant   Why:  10:10am   Contact information:   [address] [place] [address] [phone]       Discharge Medications    Medication List    TAKE these medications        diazepam 10 MG tablet  Commonly known as:  VALIUM  Take 1 tablet (10 mg total) by mouth every 12 (twelve) hours as needed for anxiety. May refill 01/10/14     fluticasone 50 MCG/ACT nasal spray  Commonly known as:  FLONASE  Place 2 sprays into both nostrils daily.  furosemide 40 MG tablet  Commonly known as:  LASIX  Take 1 tablet (40 mg total) by mouth 2 (two) times daily.     gabapentin 100 MG capsule  Commonly known as:  NEURONTIN  Take 3 tabs PO qhs for RLS.     HYDROcodone-acetaminophen 5-325 MG per tablet  Commonly known as:  NORCO  Take 1 tablet by mouth every 8 (eight) hours as needed for moderate pain. May fill on 01/10/14     hyoscyamine 0.125 MG tablet  Commonly known as:  LEVSIN, ANASPAZ  Take 1 tablet (0.125 mg total) by mouth 3 (three) times daily as needed for cramping.     levothyroxine 100 MCG tablet  Commonly known as:  SYNTHROID,  LEVOTHROID     omeprazole 20 MG capsule  Commonly known as:  PRILOSEC  Take 1 capsule (20 mg total) by mouth daily.     QUEtiapine 300 MG tablet  Commonly known as:  SEROQUEL  Take 1 tablet (300 mg total) by mouth at bedtime.     rOPINIRole 2 MG tablet  Commonly known as:  REQUIP  Take 1 tablet (2 mg total) by mouth at bedtime.     sildenafil 100 MG tablet  Commonly known as:  VIAGRA  Take 0.5 tablets (50 mg total) by mouth daily as needed for erectile dysfunction. Monitor blood pressure, dizziness.     sucralfate 1 G tablet  Commonly known as:  CARAFATE     traZODone 50 MG tablet  Commonly known as:  DESYREL  TAKE 1 TABLET (50 MG TOTAL) BY MOUTH AT BEDTIME.        Outstanding Labs/Studies  1 week BMET lab  Duration of Discharge Encounter   Greater than 30 minutes including physician time.  Ramond DialSigned, Antonio Wiehe PA-C Pager: [phone] 01/27/2014, 4:45 PM

## 2014-01-27 NOTE — Discharge Instructions (Signed)
No driving for 24 hours. No lifting over 5 lbs for 1 week. No sexual activity for 1 week.   Chest Pain (Nonspecific) It is often hard to give a specific diagnosis for the cause of chest pain. There is always a chance that your pain could be related to something serious, such as a heart attack or a blood clot in the lungs. You need to follow up with your health care provider for further evaluation. CAUSES   Heartburn.  Pneumonia or bronchitis.  Anxiety or stress.  Inflammation around your heart (pericarditis) or lung (pleuritis or pleurisy).  A blood clot in the lung.  A collapsed lung (pneumothorax). It can develop suddenly on its own (spontaneous pneumothorax) or from trauma to the chest.  Shingles infection (herpes zoster virus). The chest wall is composed of bones, muscles, and cartilage. Any of these can be the source of the pain.  The bones can be bruised by injury.  The muscles or cartilage can be strained by coughing or overwork.  The cartilage can be affected by inflammation and become sore (costochondritis). DIAGNOSIS  Lab tests or other studies may be needed to find the cause of your pain. Your health care provider may have you take a test called an ambulatory electrocardiogram (ECG). An ECG records your heartbeat patterns over a 24-hour period. You may also have other tests, such as:  Transthoracic echocardiogram (TTE). During echocardiography, sound waves are used to evaluate how blood flows through your heart.  Transesophageal echocardiogram (TEE).  Cardiac monitoring. This allows your health care provider to monitor your heart rate and rhythm in real time.  Holter monitor. This is a portable device that records your heartbeat and can help diagnose heart arrhythmias. It allows your health care provider to track your heart activity for several days, if needed.  Stress tests by exercise or by giving medicine that makes the heart beat faster. TREATMENT   Treatment  depends on what may be causing your chest pain. Treatment may include:  Acid blockers for heartburn.  Anti-inflammatory medicine.  Pain medicine for inflammatory conditions.  Antibiotics if an infection is present.  You may be advised to change lifestyle habits. This includes stopping smoking and avoiding alcohol, caffeine, and chocolate.  You may be advised to keep your head raised (elevated) when sleeping. This reduces the chance of acid going backward from your stomach into your esophagus. Most of the time, nonspecific chest pain will improve within 2-3 days with rest and mild pain medicine.  HOME CARE INSTRUCTIONS   If antibiotics were prescribed, take them as directed. Finish them even if you start to feel better.  For the next few days, avoid physical activities that bring on chest pain. Continue physical activities as directed.  Do not use any tobacco products, including cigarettes, chewing tobacco, or electronic cigarettes.  Avoid drinking alcohol.  Only take medicine as directed by your health care provider.  Follow your health care provider's suggestions for further testing if your chest pain does not go away.  Keep any follow-up appointments you made. If you do not go to an appointment, you could develop lasting (chronic) problems with pain. If there is any problem keeping an appointment, call to reschedule. SEEK MEDICAL CARE IF:   Your chest pain does not go away, even after treatment.  You have a rash with blisters on your chest.  You have a fever. SEEK IMMEDIATE MEDICAL CARE IF:   You have increased chest pain or pain that spreads to your  arm, neck, jaw, back, or abdomen.  You have shortness of breath.  You have an increasing cough, or you cough up blood.  You have severe back or abdominal pain.  You feel nauseous or vomit.  You have severe weakness.  You faint.  You have chills. This is an emergency. Do not wait to see if the pain will go away. Get  medical help at once. Call your local emergency services (911 in [place] Do not drive yourself to the hospital. MAKE SURE YOU:   Understand these instructions.  Will watch your condition.  Will get help right away if you are not doing well or get worse. Document Released: 12/08/2004 Document Revised: 03/05/2013 Document Reviewed: 10/04/2007 [facility] Patient Information 2015 [place] [place]. This information is not intended to replace advice given to you by your health care provider. Make sure you discuss any questions you have with your health care provider.  Keep procedure site clean & dry. If you notice increased pain, swelling, bleeding or pus, call/return!  You may shower, but no soaking baths/hot tubs/pools for 1 week.     Cardiac Diet A cardiac diet can help stop heart disease or a stroke from happening. It involves eating less unhealthy fats and eating more healthy fats.  FOODS TO AVOID OR LIMIT  Limit saturated fats. This type of fat is found in oils and dairy products, such as:  Coconut oil.  Palm oil.  Cocoa butter.  Butter.  Avoid trans-fat or hydrogenated oils. These are found in fried or pre-made baked goods, such as:  Margarine.  Pre-made cookies, cakes, and crackers.  Limit processed meats (hot dogs, deli meats, sausage) to 3 ounces a week.  Limit high-fat meats (marbled meats, fried chicken, or chicken with skin) to 3 ounces a week.  Limit salt (sodium) to 1500 milligrams a day.   Limit sweets and drinks with added sugar to no more than 5 servings a week. One serving is:  1 tablespoon of sugar.  1 tablespoon of jelly or jam.   cup sorbet.  1 cup lemonade.   cup regular soda. EAT MORE OF THE FOLLOWING FOODS Fruit  Eat 4to 5 servings a day. One serving of fruit is:  1 medium whole fruit.   cup dried fruit.   cup of fresh, frozen, or canned fruit.   cup 100% fruit juice. Vegetables  Eat 4 to 5 servings a day. One serving is:  1 cup  raw leafy vegetables.   cup raw or cooked, cut-up vegetables.   cup vegetable juice. Whole Grains  Eat 3 servings a day (1 ounce equals 1 serving). Legumes (such as beans, peas, and lentils)   Eat at least 4 servings a week ( cup equals 1 serving). Nuts and Seeds   Eat at least 4 servings a week ( cup equals 1 serving). Dietary Fiber  Eat 20 to 30 grams a day. Some foods high in dietary fiber include:  Dried beans.  Citrus fruits.  Apples, bananas.  Broccoli, Brussels sprouts, and eggplant.  Oats. Omega-3 Fats  Eat food with omega-3 fats. You can also take a dietary pill (supplement) that has 1 gram of DHA and EPA. Have 3.5 ounces of fatty fish a week, such as:  Salmon.  Mackerel.  Albacore tuna.  Sardines.  Lake trout.  Herring. PREPARING YOUR FOOD  Broil, bake, steam, or roast foods. Do not fry food. Do not cook food in butter (fat).  Use non-stick cooking sprays.  Remove skin from poultry,  such as chicken and [place].  Remove fat from meat.  Take the fat off the top of stews, soups, and gravy.  Use lemon or herbs to flavor food instead of using butter or margarine.  Use nonfat yogurt, salsa, or low-fat dressings for salads. Document Released: 08/30/2011 Document Reviewed: 08/30/2011 [facility] Patient Information 2015 [place], [place]. This information is not intended to replace advice given to you by your health care provider. Make sure you discuss any questions you have with your health care provider.

## 2014-01-27 NOTE — Progress Notes (Signed)
Site area: rt groin Site Prior to Removal:  Level 0 Pressure Applied For: 20 minutes Manual:   yes Patient Status During Pull:  stable Post Pull Site:  Level 0 Post Pull Instructions Given:  yes Post Pull Pulses Present: yes Dressing Applied:  tegaderm Bedrest begins @ 1445 Comments: no complications

## 2014-01-27 NOTE — Progress Notes (Addendum)
ANTICOAGULATION CONSULT NOTE -  Pharmacy Consult for Heparin  Indication: chest pain/ACS  Vital Signs: Temp: 98.5 F (36.9 C) (11/16 0000) Temp Source: Oral (11/15 2000) BP: 122/65 mmHg (11/16 0000) Pulse Rate: 81 (11/16 0000)  Labs:  Recent Labs  01/24/14 1732  01/25/14 0422  01/26/14 0415 01/26/14 1308 01/26/14 1320 01/26/14 1656 01/27/14 0010  HGB 12.2*  --  10.9*  --  12.1*  --   --  12.7*  --   HCT 37.8*  --  34.4*  --  38.1*  --   --  40.2  --   PLT 297  --  218  --  255  --   --  263  --   LABPROT  --   --   --   --   --   --   --  14.6  --   INR  --   --   --   --   --   --   --  1.13  --   HEPARINUNFRC  --   --   --   < > 0.73*  --  0.46 0.77* 0.58  CREATININE 1.03  --  0.86  --   --   --   --  1.14  --   TROPONINI  --   < > <0.30  < >  --  <0.30  --  <0.30 <0.30  < > = values in this interval not displayed.  Assessment: 58 y.o. male with chest pain for heparin   Goal of Therapy:  Heparin level 0.3-0.7 units/ml Monitor platelets by anticoagulation protocol: Yes   Plan:  Continue Heparin at current rate Follow-up am labs.  Geannie RisenGreg Abbott, PharmD, BCPS 01/27/2014,12:52 AM    Addendum -Morning heparin level drawn a few hours early was therapeutic -Continue heparin gtta t 1600 units/hr -hgb 12.1, stable, plts wnl -F/u after cath today    Agapito GamesAlison Jdyn Parkerson, PharmD, BCPS Clinical Pharmacist Pager: [phone] 01/27/2014 9:53 AM

## 2014-01-28 LAB — POCT I-STAT 3, ART BLOOD GAS (G3+)
Acid-Base Excess: 3 mmol/L — ABNORMAL HIGH (ref 0.0–2.0)
BICARBONATE: 27.8 meq/L — AB (ref 20.0–24.0)
O2 Saturation: 94 %
PH ART: 7.402 (ref 7.350–7.450)
TCO2: 29 mmol/L (ref 0–100)
pCO2 arterial: 44.7 mmHg (ref 35.0–45.0)
pO2, Arterial: 71 mmHg — ABNORMAL LOW (ref 80.0–100.0)

## 2014-01-28 LAB — POCT I-STAT 3, VENOUS BLOOD GAS (G3P V)
Acid-Base Excess: 3 mmol/L — ABNORMAL HIGH (ref 0.0–2.0)
BICARBONATE: 29.6 meq/L — AB (ref 20.0–24.0)
O2 Saturation: 71 %
PCO2 VEN: 51 mmHg — AB (ref 45.0–50.0)
PH VEN: 7.371 — AB (ref 7.250–7.300)
PO2 VEN: 39 mmHg (ref 30.0–45.0)
TCO2: 31 mmol/L (ref 0–100)

## 2014-01-29 ENCOUNTER — Telehealth: Payer: Self-pay | Admitting: *Deleted

## 2014-01-29 NOTE — Telephone Encounter (Signed)
Patient called and stated he just got out of the hospital and needed follow up appointment with Dr. Conley RollsLe this Friday 01/31/2014.  I stated she only had an 8:00 am appt available on that day.   He told me Dr. Conley RollsLe has to be on time and can not start his appt late, since he has a dentist appt at 10:00.  In the end, he demanded to have Dr. Conley RollsLe call him and not schedule the appt.   When I asked what message he would like for me to leave, he said, "Dr. Conley RollsLe knew his issues and to call him today".  Pt # [phone]

## 2014-01-29 NOTE — Telephone Encounter (Signed)
Spoke with pt and advised he needed to schedule a follow up appt. Advised pt Dr. Conley RollsLe was not in the office today. Transferred pt to schedule the appt. See message below.

## 2014-01-30 ENCOUNTER — Institutional Professional Consult (permissible substitution): Payer: Federal, State, Local not specified - PPO | Admitting: Internal Medicine

## 2014-01-30 NOTE — Telephone Encounter (Signed)
Spoke with patient. He is having CP like he did before wants to know if he can get in to see a gastroenterologist ASAP since his CP is most likely related to his henria ( [facility] [place] with negative troponins and cardiac cath) , apparently [facility] has [facility] and saw Dr Lorin PicketWestcott, he has seen [facility] as well, he has an appt [place] doctor in [place] on 03/01/14. I told him there is not much I can do, if he really wants to know if his hernia is the source of his abd/CP pain then he needs to get an EGD as advise by prior GI notes. He states he wants to feel comfortable with the doctor, I stated that was fine but I cannot work any magic for him to get him to see a GI any faster in cases that are not emergent. He will see me tomorrow and we will see how his weight is, etc.

## 2014-01-31 ENCOUNTER — Encounter: Payer: Self-pay | Admitting: Family Medicine

## 2014-01-31 ENCOUNTER — Ambulatory Visit (INDEPENDENT_AMBULATORY_CARE_PROVIDER_SITE_OTHER): Payer: Federal, State, Local not specified - PPO | Admitting: Family Medicine

## 2014-01-31 VITALS — BP 116/82 | HR 87 | Temp 98.2°F | Resp 16 | Ht 75.0 in | Wt 288.4 lb

## 2014-01-31 DIAGNOSIS — G2581 Restless legs syndrome: Secondary | ICD-10-CM

## 2014-01-31 DIAGNOSIS — F4310 Post-traumatic stress disorder, unspecified: Secondary | ICD-10-CM

## 2014-01-31 DIAGNOSIS — Z09 Encounter for follow-up examination after completed treatment for conditions other than malignant neoplasm: Secondary | ICD-10-CM

## 2014-01-31 DIAGNOSIS — F411 Generalized anxiety disorder: Secondary | ICD-10-CM

## 2014-01-31 DIAGNOSIS — G894 Chronic pain syndrome: Secondary | ICD-10-CM

## 2014-01-31 DIAGNOSIS — R600 Localized edema: Secondary | ICD-10-CM

## 2014-01-31 DIAGNOSIS — R1013 Epigastric pain: Secondary | ICD-10-CM

## 2014-01-31 MED ORDER — DIAZEPAM 10 MG PO TABS
10.0000 mg | ORAL_TABLET | Freq: Two times a day (BID) | ORAL | Status: DC | PRN
Start: 2014-01-31 — End: 2014-03-13

## 2014-01-31 MED ORDER — GABAPENTIN 300 MG PO CAPS: ORAL_CAPSULE | ORAL | Status: DC

## 2014-01-31 MED ORDER — HYDROCODONE-ACETAMINOPHEN 5-325 MG PO TABS: 1.0000 | ORAL_TABLET | Freq: Three times a day (TID) | ORAL | Status: DC | PRN

## 2014-01-31 NOTE — Progress Notes (Signed)
c    Chief Complaint:  Chief Complaint  Patient presents with  . Follow-up  . hospital vs 01/24/14    HPI: Antonio HardingJames Dukes Jr. is a 58 y.o. male who is here for follow-up from [facility] from 11/13-11/16/15 for chest pain work up and also his acute on chronic issues.    1. The chest pain workup was negative, tropponins were negative, he had an echocardiogram and also bilateral cardiac cath which were pretty nremarkable. He was dc home on Lasix 40 mg BID and was told to follow-up with cardiology in 1 week for labs only for BMET and then 2 weeks after that for re-evaluation of his sxs. He continues to complain of bialterla pedal edema, shortness of breath intermittently and also chest pain and abd pain, his stomach really bothers him.   2. Hiatal Hernia- has seen Dr Lorin PicketWestcott, saw him last Wednesday , stated his hernia was insignificant. He got a CT scan. He does not want to go back there. He would like to get a workup for this as soon as possible. [facility] and [facility] ? , but per OV note EGD will be need needed to establish the  Status of his hernia and then will go from there if a surgical referral is needed. Today he bring sme in a sheet with the words "[facility] " on it. He  Would like a referral there if he needs to get a surgery. I told him it is the wrong department, if he needs surgery then general surgery will probably be the folks to do this.  He is supposed to see Dr Rise PatienceMartin Skulsky [facility] [place] in December but can;t wait that long. He has been there 15 years ago, he saw him for a reguar basis for 10 yers. He did the first nissen and then he did th esecond one, he did the EGD and then referred him to surgery.   3. He is stable on his chronic pain meds for his multiple low back issues. No saddle anesthesia  4. He is still having breakthrough RLS sxs and was wondeirng if we can increase his gabapentin dose to 1500 mg daily. He  currently in on 300 nightly . He used to be on 1500 mg daily and had no problems.    [facility] is not planning to [facility].    Unremarkable echo and cardiac cath on 01/26/2014 The echo showed the following:  ------------------------------------------------------------------- Study Conclusions  - Left ventricle: Cannot rule out focal distal septal akinesis. The cavity size was normal. There was moderate focal basal hypertrophy. Systolic function was normal. The estimated ejection fraction was in the range of 50% to 55%. Wall motion was normal; there were no regional wall motion abnormalities. There was an increased relative contribution of atrial contraction to ventricular filling. Doppler parameters are consistent with abnormal left ventricular relaxation (grade 1 diastolic dysfunction). - Ascending aorta: The ascending aorta was mildly dilated. - Right ventricle: Systolic function was mildly reduced.  He has f/u with cardiology on 02/21/2014  Wt Readings from Last 3 Encounters:  01/31/14 288 lb 6.4 oz (130.817 kg)  01/27/14 285 lb 9.6 oz (129.547 kg)  01/24/14 288 lb 3.2 oz (130.727 kg)    BP Readings from Last 3 Encounters:  01/31/14 116/82  01/27/14 105/49  01/24/14 134/94    This is pertinent info from my last OV with him on  12/30/2013 : Antonio Reasons. is a 58 y.o. male who is here for evaluation after hospital discharge from [facility] from 12/19/2013-12/22/2013 for probable ileus Which resolved at time of discharge.  During his hospitalization he was NPO, was given IVF and had an NG tube placed. He had a CT scan of the abdomen and pelvis Which showed fluid filled loops of small and large bowel which could reflect underlying ileues. Moderater hiatal hernia. No mechanical obstruction was seen. At time of discharge he was able to tolerate some solids but he still complains of stomach bloating and abd pain after he  eats.   He also had labs done: CMP showed elevated liver enzymes ( AST 120, ALT 121) But subsequent hepatitis profile was negative. His LFTs in our office have always been normal.  He also had an echo done which showed normal LV EF at 55-60%, grade 1 diastolic dysfunction and normal right ventricular systolic function based on DC summary.  He was found to have subclinical hypothyroidism, TSH was 7.5 and was put on synthroid but he does not remember the dose. He actually had a TSH done here in 08/2013 and that was normal.   His other complaints besides his stomach bloating include bilateral leg edema, weight gain, and also erectile dysfunction ( since he has been off methadone his libido has been very strong but he has a hard time maintaining an erection, gettign an erection is not an issue), and also he stubbed his left toe on a dresser last night and has severe pain inehis left pinky toe And he thinks it is broken. He would like an xray.   Normal weight for him is 265 lb, he states the rest of this was "water". He states his eating is the same ,he feels nauseated when food is in front of him, he feel his stomach "blows up" when he even looks at food.    Of pertinent interest: Antonio Cabrera is a chronic pain patient who used to be on methadone, then due to a serendiptous series of events he was unable to get methadone from his PCP at [facility] because no one in her practice would prescirbe it to him while she was on vacation, and subsequently he just decided he wanted to be off of the methadone. He was also discharged from the practice because he emissed his appts. So he tapered himself of f his methadone and some of his other chronic medicines. He came to ot see [place] and has been seeing me consistently for several months. He was palced on norco for his pain control. I had referred him to [facility] but according to the patient he got into an argument with the director over some misunderstanding and  got discahrged. He does not want to go to the [facility] since he does not want to be on any meds stronger than what he is on right now.   I have referred him to neuropsychiatry Due to his many and high doses of psychaitric meds he is on for PTSD, RLS, and insomnia but the next appt they have is in Nov and he is not sure he can make it, he is taking care of elderly demented patients in [place] . He is also trying to get POA from his brother over his parent's medical care and finances since he feels his brother is not well intentioned and up to the task.   Past Medical History  Diagnosis Date  . Arthritis   .  Depression   . Allergy   . Hypoglycemia   . GERD (gastroesophageal reflux disease)   . PTSD (post-traumatic stress disorder)   . RLS (restless legs syndrome)   . Neuromuscular disorder   . Chronic back pain   . Anxiety    Past Surgical History  Procedure Laterality Date  . Cholecystectomy  2000  . Cervical fusion      C4-C6  . Lumbar fusion      L5-S1  . Nissen fundoplication    . Ankle arthroscopy    . Knee arthroscopy    . Hernia repair    . Vasectomy    . Spine surgery      Dr Laymond Purser ( decease-neurosurgeon in [place] age 41)    History   Social History  . Marital Status: Married    Spouse Name: N/A    Number of Children: N/A  . Years of Education: 16   Occupational History  . Retired     Retired Recruitment consultant   Social History Main Topics  . Smoking status: Never Smoker   . Smokeless tobacco: Never Used  . Alcohol Use: Yes     Comment: occasional-socially  . Drug Use: No  . Sexual Activity: Yes    Birth Control/ Protection: Surgical   Other Topics Concern  . None   Social History Narrative   Family History  Problem Relation Age of Onset  . Breast cancer Mother   . Cancer Mother   . Stroke Father   . Hypertension Father   . Diabetes Father   . Alzheimer's disease Father   . Alcohol abuse Brother    Allergies  Allergen  Reactions  . Antihistamines, Chlorpheniramine-Type Other (See Comments)    Skin crawling, agitation  . Decongestant [Pseudoephedrine] Other (See Comments)    Skin crawling  . Penicillins Rash   Prior to Admission medications   Medication Sig Start Date End Date Taking? Authorizing Provider  diazepam (VALIUM) 10 MG tablet Take 1 tablet (10 mg total) by mouth every 12 (twelve) hours as needed for anxiety. May refill 01/10/14 01/01/14  Yes Lauralee Waters P Meya Clutter, DO  fluticasone (FLONASE) 50 MCG/ACT nasal spray Place 2 sprays into both nostrils daily. 11/29/13  Yes Shima Compere P Mackenzee Becvar, DO  furosemide (LASIX) 40 MG tablet Take 1 tablet (40 mg total) by mouth 2 (two) times daily. 01/27/14  Yes Azalee Course, PA  gabapentin (NEURONTIN) 100 MG capsule Take 3 tabs PO qhs for RLS. Patient taking differently: Take 300 mg by mouth at bedtime. Take 3 tabs PO qhs for RLS. 01/08/14  Yes Adelena Desantiago P Saryn Cherry, DO  HYDROcodone-acetaminophen (NORCO) 5-325 MG per tablet Take 1 tablet by mouth every 8 (eight) hours as needed for moderate pain. May fill on 01/10/14 01/01/14  Yes Teryl Gubler P Blane Worthington, DO  hyoscyamine (LEVSIN, ANASPAZ) 0.125 MG tablet Take 1 tablet (0.125 mg total) by mouth 3 (three) times daily as needed for cramping. 02/05/13  Yes Lorre Munroe, NP  levothyroxine (SYNTHROID, LEVOTHROID) 100 MCG tablet  12/23/13  Yes Historical Provider, MD  omeprazole (PRILOSEC) 20 MG capsule Take 1 capsule (20 mg total) by mouth daily. 11/29/13  Yes Kenzlee Fishburn P Idolina Mantell, DO  QUEtiapine (SEROQUEL) 300 MG tablet Take 1 tablet (300 mg total) by mouth at bedtime. 01/01/14  Yes Robinette Esters P Shaughnessy Gethers, DO  rOPINIRole (REQUIP) 2 MG tablet Take 1 tablet (2 mg total) by mouth at bedtime. 12/07/13  Yes Karry Causer P Warwick Nick, DO  sildenafil (VIAGRA) 100 MG tablet Take 0.5  tablets (50 mg total) by mouth daily as needed for erectile dysfunction. Monitor blood pressure, dizziness. 12/30/13  Yes Danine Hor P Ethelwyn Gilbertson, DO  sucralfate (CARAFATE) 1 G tablet  12/23/13  Yes Historical Provider, MD  traZODone (DESYREL) 50 MG tablet  TAKE 1 TABLET (50 MG TOTAL) BY MOUTH AT BEDTIME. 01/27/14  Yes Zora Glendenning P Khrista Braun, DO     ROS: The patient denies fevers, chills, night sweats, unintentional weight loss,  palpitations, wheezing, dyspnea on exertion, dysuria, hematuria, melena, numbness, weakness, or tingling.  + abd pain, minimal nausea when he eats or drinks "anything", abd distension,   All other systems have been reviewed and were otherwise negative with the exception of those mentioned in the HPI and as above.    PHYSICAL EXAM: Filed Vitals:   01/31/14 0835  BP: 116/82  Pulse: 87  Temp: 98.2 F (36.8 C)  Resp: 16  Spo2 97% Filed Vitals:   01/31/14 0835  Height: 6\' 3"  (1.905 m)  Weight: 288 lb 6.4 oz (130.817 kg)   Body mass index is 36.05 kg/(m^2).  General: Alert, no acute distress HEENT:  Normocephalic, atraumatic, oropharynx patent. EOMI, PERRLA Cardiovascular:  Regular rate and rhythm, no rubs murmurs or gallops.  No Carotid bruits, radial pulse intact. Minimal/trace pedal edema.  Respiratory: Clear to auscultation bilaterally.  No wheezes, rales, or rhonchi.  No cyanosis, no use of accessory musculature GI: No organomegaly, abdomen is soft and non-tender when he is sitting, he has more distension when standing, positive hyperactive bowel sounds.  No masses. No s/sxs of acute abdomen Skin: No rashes. Neurologic: Facial musculature symmetric. Psychiatric: Patient is appropriate throughout our interaction. Lymphatic: No cervical lymphadenopathy Musculoskeletal: Gait intact.   LABS: Results for orders placed or performed during the hospital encounter of 01/24/14  MRSA PCR Screening  Result Value Ref Range   MRSA by PCR NEGATIVE NEGATIVE  Basic metabolic panel  Result Value Ref Range   Sodium 141 137 - 147 mEq/L   Potassium 3.7 3.7 - 5.3 mEq/L   Chloride 104 96 - 112 mEq/L   CO2 22 19 - 32 mEq/L   Glucose, Bld 143 (H) 70 - 99 mg/dL   BUN 18 6 - 23 mg/dL   Creatinine, Ser 1.61 0.50 - 1.35 mg/dL   Calcium  9.1 8.4 - 09.6 mg/dL   GFR calc non Af Amer 79 (L) >90 mL/min   GFR calc Af Amer >90 >90 mL/min   Anion gap 15 5 - 15  CBC  Result Value Ref Range   WBC 7.0 4.0 - 10.5 K/uL   RBC 4.78 4.22 - 5.81 MIL/uL   Hemoglobin 12.2 (L) 13.0 - 17.0 g/dL   HCT 04.5 (L) 40.9 - 81.1 %   MCV 79.1 78.0 - 100.0 fL   MCH 25.5 (L) 26.0 - 34.0 pg   MCHC 32.3 30.0 - 36.0 g/dL   RDW 91.4 78.2 - 95.6 %   Platelets 297 150 - 400 K/uL  CBC  Result Value Ref Range   WBC 4.8 4.0 - 10.5 K/uL   RBC 4.23 4.22 - 5.81 MIL/uL   Hemoglobin 10.9 (L) 13.0 - 17.0 g/dL   HCT 21.3 (L) 08.6 - 57.8 %   MCV 81.3 78.0 - 100.0 fL   MCH 25.8 (L) 26.0 - 34.0 pg   MCHC 31.7 30.0 - 36.0 g/dL   RDW 46.9 62.9 - 52.8 %   Platelets 218 150 - 400 K/uL  Heparin level (unfractionated)  Result Value Ref Range  Heparin Unfractionated <0.10 (L) 0.30 - 0.70 IU/mL  Pro b natriuretic peptide (BNP)  Result Value Ref Range   Pro B Natriuretic peptide (BNP) <5.0 0 - 125 pg/mL  Troponin I-(serum)  Result Value Ref Range   Troponin I <0.30 <0.30 ng/mL  Troponin I-(serum)  Result Value Ref Range   Troponin I <0.30 <0.30 ng/mL  Hemoglobin A1c  Result Value Ref Range   Hgb A1c MFr Bld 5.9 (H) <5.7 %   Mean Plasma Glucose 123 (H) <117 mg/dL  Basic metabolic panel  Result Value Ref Range   Sodium 142 137 - 147 mEq/L   Potassium 3.7 3.7 - 5.3 mEq/L   Chloride 104 96 - 112 mEq/L   CO2 21 19 - 32 mEq/L   Glucose, Bld 118 (H) 70 - 99 mg/dL   BUN 20 6 - 23 mg/dL   Creatinine, Ser 1.61 0.50 - 1.35 mg/dL   Calcium 8.2 (L) 8.4 - 10.5 mg/dL   GFR calc non Af Amer >90 >90 mL/min   GFR calc Af Amer >90 >90 mL/min   Anion gap 17 (H) 5 - 15  Lipid panel  Result Value Ref Range   Cholesterol 118 0 - 200 mg/dL   Triglycerides 55 <096 mg/dL   HDL 41 >04 mg/dL   Total CHOL/HDL Ratio 2.9 RATIO   VLDL 11 0 - 40 mg/dL   LDL Cholesterol 66 0 - 99 mg/dL  Pro b natriuretic peptide (BNP)  Result Value Ref Range   Pro B Natriuretic peptide (BNP)  7.2 0 - 125 pg/mL  Heparin level (unfractionated)  Result Value Ref Range   Heparin Unfractionated 0.45 0.30 - 0.70 IU/mL  Troponin I-(serum)  Result Value Ref Range   Troponin I <0.30 <0.30 ng/mL  Heparin level (unfractionated)  Result Value Ref Range   Heparin Unfractionated 0.40 0.30 - 0.70 IU/mL  CBC  Result Value Ref Range   WBC 5.6 4.0 - 10.5 K/uL   RBC 4.67 4.22 - 5.81 MIL/uL   Hemoglobin 12.1 (L) 13.0 - 17.0 g/dL   HCT 54.0 (L) 98.1 - 19.1 %   MCV 81.6 78.0 - 100.0 fL   MCH 25.9 (L) 26.0 - 34.0 pg   MCHC 31.8 30.0 - 36.0 g/dL   RDW 47.8 (H) 29.5 - 62.1 %   Platelets 255 150 - 400 K/uL  Heparin level (unfractionated)  Result Value Ref Range   Heparin Unfractionated 0.73 (H) 0.30 - 0.70 IU/mL  Heparin level (unfractionated)  Result Value Ref Range   Heparin Unfractionated 0.46 0.30 - 0.70 IU/mL  Troponin I (q 6hr x 3)  Result Value Ref Range   Troponin I <0.30 <0.30 ng/mL  Troponin I (q 6hr x 3)  Result Value Ref Range   Troponin I <0.30 <0.30 ng/mL  Troponin I (q 6hr x 3)  Result Value Ref Range   Troponin I <0.30 <0.30 ng/mL  Heparin level (unfractionated)  Result Value Ref Range   Heparin Unfractionated 0.77 (H) 0.30 - 0.70 IU/mL  Basic metabolic panel  Result Value Ref Range   Sodium 139 137 - 147 mEq/L   Potassium 3.7 3.7 - 5.3 mEq/L   Chloride 99 96 - 112 mEq/L   CO2 27 19 - 32 mEq/L   Glucose, Bld 115 (H) 70 - 99 mg/dL   BUN 18 6 - 23 mg/dL   Creatinine, Ser 3.08 0.50 - 1.35 mg/dL   Calcium 9.0 8.4 - 65.7 mg/dL   GFR calc non Af Amer 70 (  L) >90 mL/min   GFR calc Af Amer 81 (L) >90 mL/min   Anion gap 13 5 - 15  Protime-INR  Result Value Ref Range   Prothrombin Time 14.6 11.6 - 15.2 seconds   INR 1.13 0.00 - 1.49  CBC  Result Value Ref Range   WBC 6.1 4.0 - 10.5 K/uL   RBC 4.90 4.22 - 5.81 MIL/uL   Hemoglobin 12.7 (L) 13.0 - 17.0 g/dL   HCT 84.1 32.4 - 40.1 %   MCV 82.0 78.0 - 100.0 fL   MCH 25.9 (L) 26.0 - 34.0 pg   MCHC 31.6 30.0 - 36.0  g/dL   RDW 02.7 25.3 - 66.4 %   Platelets 263 150 - 400 K/uL  Platelet inhibition p2y12 (if on daily thienopyridine)  Result Value Ref Range   Platelet Function  P2Y12 286 194 - 418 PRU  CBC  Result Value Ref Range   WBC 5.5 4.0 - 10.5 K/uL   RBC 4.65 4.22 - 5.81 MIL/uL   Hemoglobin 12.1 (L) 13.0 - 17.0 g/dL   HCT 40.3 (L) 47.4 - 25.9 %   MCV 81.5 78.0 - 100.0 fL   MCH 26.0 26.0 - 34.0 pg   MCHC 31.9 30.0 - 36.0 g/dL   RDW 56.3 87.5 - 64.3 %   Platelets 259 150 - 400 K/uL  Heparin level (unfractionated)  Result Value Ref Range   Heparin Unfractionated 0.62 0.30 - 0.70 IU/mL  Heparin level (unfractionated)  Result Value Ref Range   Heparin Unfractionated 0.58 0.30 - 0.70 IU/mL  I-stat troponin, ED (not at [facility])  Result Value Ref Range   Troponin i, poc 0.00 0.00 - 0.08 ng/mL   Comment 3          POCT Activated clotting time  Result Value Ref Range   Activated Clotting Time 90 seconds  I-STAT 3, arterial blood gas (G3+)  Result Value Ref Range   pH, Arterial 7.402 7.350 - 7.450   pCO2 arterial 44.7 35.0 - 45.0 mmHg   pO2, Arterial 71.0 (L) 80.0 - 100.0 mmHg   Bicarbonate 27.8 (H) 20.0 - 24.0 mEq/L   TCO2 29 0 - 100 mmol/L   O2 Saturation 94.0 %   Acid-Base Excess 3.0 (H) 0.0 - 2.0 mmol/L   Sample type ARTERIAL   I-STAT 3, venous blood gas (G3P V)  Result Value Ref Range   pH, Ven 7.371 (H) 7.250 - 7.300   pCO2, Ven 51.0 (H) 45.0 - 50.0 mmHg   pO2, Ven 39.0 30.0 - 45.0 mmHg   Bicarbonate 29.6 (H) 20.0 - 24.0 mEq/L   TCO2 31 0 - 100 mmol/L   O2 Saturation 71.0 %   Acid-Base Excess 3.0 (H) 0.0 - 2.0 mmol/L   Sample type VENOUS      EKG/XRAY:   Primary read interpreted by Dr. Conley Rolls at [facility].   ASSESSMENT/PLAN: Encounter Diagnoses  Name Primary?  [facility] follow-up Yes  . Bilateral edema of lower extremity   . Chronic pain syndrome   . Restless leg   . PTSD (post-traumatic stress disorder)    Antonio Broad is a challenging 58 y/o gentleman with a  complicated past medical hx including PTSD, RLS, GAD/depression, hiatal hernia s/p nissen repair x 2, multiple back surgeries, recent ileus in 12/2013 requiring hospitalization, chronic pain syndrome with prior use of methadone for >10 years who was recently weaned off of methadone in 08/2013. He is on high dose antipsychotropic drugs for RLS and also norco  for his chronic problems. He presents today for hospital dc followup from 11/13-11/16/15  for chest pains sxs and also CHF like sxs ( ie SOB/DOE, pedal edema and weight gain and chest pain). His chest pain work up was preety unremarkable, negative troponins, neg BNP, negative right and left cardiac cath.   1. Chest pain issues, pedal edema-cont with Lasix 40 mg BID as directed follow-up with cardiology fro BMET in 1 week and then on 04/24/13 for office evaluations. Since cardiac cath was negative , it was recommended that they find other causes for his chest pain. This may be related to his moderate hiatal hernia and hx of GERD with " 2 failed Nissen fundoplications per the patient". [facility]. [facility] not [facility] to [facility] to Dr Earnestine LeysWetscott. Will refer him back to [place] and see if EGD can be done to see the extent of  His hiatal hernia.  I think his SOB and CP sxs are also possibly related to his PTSD/GAD. He does not handle stress well, he is already on Valium 10 mg , and I do not feel comofortable giving him any additional medicines since he is already on so many psych meds.   2. PTSD/GAD/RLS-has a referral with Dr Lorenda IshiharaMojeed, continue with Valium, Seroquel,Requip. I have increase his Gabapentin from 300 mg to 900 mg since he has had this before and was on 1500 mg and it worked for him.   3. Chronic pain-refilled Norco and valium. Pain medication policy was d/w patient.   4. He needs to return in 1 month to get his TSH rechecked and also for chronic med refills and coordination of care.   Gross sideeffects, risk and benefits, and  alternatives of medications d/w patient. Patient is aware that all medications have potential sideeffects and we are unable to predict every sideeffect or drug-drug interaction that may occur.  Gavin Faivre PHUONG, DO 01/31/2014 9:12 AM

## 2014-02-01 ENCOUNTER — Other Ambulatory Visit: Payer: Self-pay | Admitting: Family Medicine

## 2014-02-03 ENCOUNTER — Other Ambulatory Visit: Payer: Federal, State, Local not specified - PPO

## 2014-02-04 ENCOUNTER — Telehealth: Payer: Self-pay | Admitting: *Deleted

## 2014-02-04 ENCOUNTER — Other Ambulatory Visit (INDEPENDENT_AMBULATORY_CARE_PROVIDER_SITE_OTHER): Payer: Federal, State, Local not specified - PPO

## 2014-02-04 ENCOUNTER — Other Ambulatory Visit: Payer: Self-pay | Admitting: Family Medicine

## 2014-02-04 DIAGNOSIS — I509 Heart failure, unspecified: Secondary | ICD-10-CM

## 2014-02-04 DIAGNOSIS — N401 Enlarged prostate with lower urinary tract symptoms: Secondary | ICD-10-CM

## 2014-02-04 DIAGNOSIS — I50811 Acute right heart failure: Secondary | ICD-10-CM

## 2014-02-04 DIAGNOSIS — R350 Frequency of micturition: Principal | ICD-10-CM

## 2014-02-04 LAB — BASIC METABOLIC PANEL
BUN: 15 mg/dL (ref 6–23)
CHLORIDE: 105 meq/L (ref 96–112)
CO2: 24 meq/L (ref 19–32)
Calcium: 8.9 mg/dL (ref 8.4–10.5)
Creatinine, Ser: 1.1 mg/dL (ref 0.4–1.5)
GFR: 73.37 mL/min (ref >60.00)
Glucose, Bld: 92 mg/dL (ref 70–99)
Potassium: 3.8 mEq/L (ref 3.5–5.1)
Sodium: 140 mEq/L (ref 135–145)

## 2014-02-04 MED ORDER — TADALAFIL 2.5 MG PO TABS: 1.0000 | ORAL_TABLET | Freq: Every day | ORAL | Status: DC

## 2014-02-04 NOTE — Telephone Encounter (Signed)
Pt called about prior auth for Cialis and it was denied.  They would not give a denial reason and said we would have to wait for fax from them.  Advised pt an pt was really upset but I told him the only thing that he could do is wait on the letter for a denial that will explain everything.  They will not give this information to me or pt.  I have called and spoke with insurance several times.  Last call from pt they will try and use wifes pharmacy ins and will call me tom with info.

## 2014-02-04 NOTE — Telephone Encounter (Signed)
Spoke with patient, we will do a trila to see if sxs improve, he did not have a very large prostate on exam, PSA was normal, he was havung increase freq 3-4 times at night. He knows he will have to take this daily, i am starting on low dose since he is on all these other meds that may make him dizzy.

## 2014-02-04 NOTE — Telephone Encounter (Signed)
See in basket message to Dr. Le---patient is needing his Cialis to be associated with a  BPH dx for insurance company to pay for it without his having to pay for a copay.  Please call patient back at [phone] asap.  Pre-approval number for [employer] Cialis 5mg  tablets  [id number]

## 2014-02-05 ENCOUNTER — Telehealth: Payer: Self-pay

## 2014-02-05 ENCOUNTER — Telehealth: Payer: Self-pay | Admitting: *Deleted

## 2014-02-05 DIAGNOSIS — K449 Diaphragmatic hernia without obstruction or gangrene: Secondary | ICD-10-CM

## 2014-02-05 NOTE — Telephone Encounter (Signed)
Pt calling inquiring about a referral for an endoscopy. According to Dr. Irwin BrakemanLe's note "Will refer him back to [facility] and see if EGD can be done to see the extent of His hiatal hernia". Referral placed.

## 2014-02-05 NOTE — Telephone Encounter (Signed)
Advised pt today that we are unable to help him at this time on trying to get Cialis approved.  We will give him more info when the denial letter come in.  Advised that we can start an appeal process when we are able and get the info.  Britta MccreedyBarbara can you please watch out for his denial letter when it comes in please so we will know why this has been denied.

## 2014-02-07 ENCOUNTER — Telehealth: Payer: Self-pay | Admitting: *Deleted

## 2014-02-07 DIAGNOSIS — R350 Frequency of micturition: Principal | ICD-10-CM

## 2014-02-07 DIAGNOSIS — N401 Enlarged prostate with lower urinary tract symptoms: Secondary | ICD-10-CM

## 2014-02-07 NOTE — Telephone Encounter (Deleted)
CIALIS 2.5MG   PHARMACY REQUEST

## 2014-02-07 NOTE — Telephone Encounter (Signed)
PA denial received and put in Dr Irwin BrakemanLe's box for review. [employer] will not approve Cialis w/out documented failure to alpha blocker or a 5-alpha reductase inhibitor. Dr Conley RollsLe, do you want to Rx one of these? I have put denial letter in your box.

## 2014-02-07 NOTE — Telephone Encounter (Signed)
Opened in error

## 2014-02-10 ENCOUNTER — Telehealth: Payer: Self-pay | Admitting: Gastroenterology

## 2014-02-10 NOTE — Telephone Encounter (Signed)
Spoke with Lupita LeashDonna and informed her that when patient was seen on 01/09/14, he was told that we need his old GI records prior to scheduling procedure. He said he would bring them. He later became angry and left our office saying he would never be back. No records have been received as of today. She will inform patient. No procedure to be scheduled until we have records per Doug SouJessica Zehr, [place].

## 2014-02-11 NOTE — Telephone Encounter (Signed)
Can you let him know that his cialis was denied, he needs to pay out of pocket for it if he wants it for BPH or ED, they want us to try flomas or something similar and then if it does not work we can try the cilais. I am not sure if he really needs flomax. I would recommend that he go online to goodrx to get a discount for the cialis if he wants to use it prn for ED only if so then we need to change the dosage.. Thanks Dr Conley RollsLe

## 2014-02-11 NOTE — Telephone Encounter (Signed)
LM for rtn call. 

## 2014-02-12 NOTE — Telephone Encounter (Signed)
Pt states he is set with this concern. He has paid out of pocket for his medication.

## 2014-02-20 ENCOUNTER — Encounter (HOSPITAL_COMMUNITY): Payer: Self-pay | Admitting: Cardiovascular Disease

## 2014-02-21 ENCOUNTER — Encounter: Payer: Federal, State, Local not specified - PPO | Admitting: Physician Assistant

## 2014-02-21 NOTE — Progress Notes (Deleted)
Cardiology Office Note   Date:  02/21/2014   ID:  Antonio HardingJames Duty Jr., DOB [birth year], MRN [id number]  PCP:  Erling CruzLE,TRI H, MD  Cardiologist:  Dr. Donato SchultzMark Skains   Electrophysiologist:  ***   History of Present Illness: Antonio HardingJames Happe Jr. is a 58 y.o. male ***   Studies:   - R/L HC (01/27/14):  RA mean 11, RV 28/9, PA 28/14, PCWP mean 12 (upper normal right heart pressures); normal coronary arteries, EF 50-55%  - Echo (01/26/14):  Moderate focal basal hypertrophy, EF 50-55%, normal wall motion, grade 1 diastolic dysfunction, mildly dilated ascending aorta, mildly reduced RV function  - Nuclear (***):  ***  - Carotid US (***):  ***   Recent Labs: 09/09/2013: TSH 1.557 12/30/2013: ALT 28 01/25/2014: LDL (calc) 66; Pro B Natriuretic peptide (BNP) 7.2 01/27/2014: Hemoglobin 12.1* 02/04/2014: BUN 15; Creatinine 1.1; Potassium 3.8; Sodium 140    Recent Radiology: Dg Chest 2 View  01/24/2014   CLINICAL DATA:  Chest pain 3 weeks with shortness of breath vomiting and cough for 2 days initial evaluation, nonsmoker  EXAM: CHEST  2 VIEW  COMPARISON:  None.  FINDINGS: Limited inspiratory effect exaggerating heart size. Allowing for this there is mild to moderate cardiac enlargement. Vascular pattern within normal limits. Lungs clear. No effusion or consolidation.  IMPRESSION: Enlargement of cardiac silhouette.  Otherwise no acute findings.   Electronically Signed   By: Esperanza Heiraymond  Rubner M.D.   On: 01/24/2014 19:22   Dg Chest 2 View  01/24/2014   CLINICAL DATA:  Two week history of chest pressure and difficulty breathing  EXAM: CHEST  2 VIEW  COMPARISON:  December 04, 2013  FINDINGS: There is no edema or consolidation. The heart is upper normal in size with pulmonary vascularity within normal limits. No adenopathy. No pneumothorax. No bone lesions.  IMPRESSION: No edema or consolidation.   Electronically Signed   By: Bretta BangWilliam  Woodruff M.D.   On: 01/24/2014 13:20   Ct Head Wo Contrast  01/24/2014    CLINICAL DATA:  Left side numbness, headache, dizziness, chest pain  EXAM: CT HEAD WITHOUT CONTRAST  TECHNIQUE: Contiguous axial images were obtained from the base of the skull through the vertex without intravenous contrast.  COMPARISON:  None.  FINDINGS: No skull fracture is noted. Paranasal sinuses and mastoid air cells are unremarkable.  No intracranial hemorrhage, mass effect or midline shift. No hydrocephalus. The gray and white-matter differentiation is preserved. Mild cerebral atrophy. No intra or extra-axial fluid collection. No acute cortical infarction. No mass lesion is noted on this unenhanced scan.  IMPRESSION: No acute intracranial abnormality.  Mild cerebral atrophy.   Electronically Signed   By: Natasha MeadLiviu  Pop M.D.   On: 01/24/2014 20:03      Wt Readings from Last 3 Encounters:  01/31/14 288 lb 6.4 oz (130.817 kg)  01/27/14 285 lb 9.6 oz (129.547 kg)  01/24/14 288 lb 3.2 oz (130.727 kg)     Past Medical History  Diagnosis Date  . Arthritis   . Depression   . Allergy   . Hypoglycemia   . GERD (gastroesophageal reflux disease)   . PTSD (post-traumatic stress disorder)   . RLS (restless legs syndrome)   . Neuromuscular disorder   . Chronic back pain   . Anxiety     Current Outpatient Prescriptions  Medication Sig Dispense Refill  . diazepam (VALIUM) 10 MG tablet Take 1 tablet (10 mg total) by mouth every 12 (twelve) hours as needed for anxiety. May refill  02/10/14 60 tablet 0  . fluticasone (FLONASE) 50 MCG/ACT nasal spray Place 2 sprays into both nostrils daily. 16 g 0  . furosemide (LASIX) 40 MG tablet Take 1 tablet (40 mg total) by mouth 2 (two) times daily. 60 tablet 3  . gabapentin (NEURONTIN) 100 MG capsule TAKE 3 CAPSULES AT BEDTIME FOR RLS 90 capsule 0  . HYDROcodone-acetaminophen (NORCO) 5-325 MG per tablet Take 1 tablet by mouth every 8 (eight) hours as needed for moderate pain. May fill on 02/10/14 90 tablet 0  . hyoscyamine (LEVSIN, ANASPAZ) 0.125 MG tablet Take 1  tablet (0.125 mg total) by mouth 3 (three) times daily as needed for cramping. 30 tablet 3  . levothyroxine (SYNTHROID, LEVOTHROID) 100 MCG tablet     . omeprazole (PRILOSEC) 20 MG capsule Take 1 capsule (20 mg total) by mouth daily. 30 capsule 3  . QUEtiapine (SEROQUEL) 300 MG tablet Take 1 tablet (300 mg total) by mouth at bedtime. 30 tablet 5  . rOPINIRole (REQUIP) 2 MG tablet Take 1 tablet (2 mg total) by mouth at bedtime. 6 tablet 0  . sildenafil (VIAGRA) 100 MG tablet Take 0.5 tablets (50 mg total) by mouth daily as needed for erectile dysfunction. Monitor blood pressure, dizziness. 5 tablet 0  . sucralfate (CARAFATE) 1 G tablet     . Tadalafil 2.5 MG TABS Take 1 tablet (2.5 mg total) by mouth daily. 90 each 0  . traZODone (DESYREL) 50 MG tablet TAKE 1 TABLET (50 MG TOTAL) BY MOUTH AT BEDTIME. 30 tablet 4   No current facility-administered medications for this visit.     Allergies:   Antihistamines, chlorpheniramine-type; Decongestant; and Penicillins   Social History:  The patient  reports that he has never smoked. He has never used smokeless tobacco. He reports that he drinks alcohol. He reports that he does not use illicit drugs.   Family History:  The patient's family history includes Alcohol abuse in his brother; Alzheimer's disease in his father; Breast cancer in his mother; Cancer in his mother; Diabetes in his father; Hypertension in his father; Stroke in his father.    ROS:  Please see the history of present illness.   ***   All other systems reviewed and negative.    PHYSICAL EXAM: VS:  There were no vitals taken for this visit. Well nourished, well developed, in no acute distress HEENT: normal Neck: *** JVD Cardiac:  normal S1, S2; ***RRR; *** murmur *** Lungs:  ***clear to auscultation bilaterally, no wheezing, rhonchi or rales Abd: soft, nontender, no hepatomegaly Ext: *** edema Skin: warm and dry Neuro:  CNs 2-12 intact, no focal abnormalities noted  EKG:  ***       ASSESSMENT AND PLAN:  No diagnosis found.   Disposition:   FU with ***   Signed, Tereso NewcomerScott Shayli Altemose, PA-C, MHS 02/21/2014 8:32 AM    [facility] [address], [place], [place]  [address] Phone: [phone]; Fax: [phone]) [phone]

## 2014-02-23 NOTE — Progress Notes (Signed)
This encounter was created in error - please disregard.

## 2014-02-28 ENCOUNTER — Ambulatory Visit: Payer: Federal, State, Local not specified - PPO | Admitting: Family Medicine

## 2014-03-03 ENCOUNTER — Other Ambulatory Visit: Payer: Self-pay | Admitting: Physician Assistant

## 2014-03-03 NOTE — Telephone Encounter (Signed)
Pt had called and LM wanting CB about Rx. Called him and he verified that it is about his RF on gabapentin. He reported that the increased dose is working better for him. Called to verify that pharm has RFs on the inc dose sent by Dr Conley RollsLe on 11/20. Advised pt RFs already at pharm. D/W pt that he is due for f/up. He agreed and will try to get in to see Dr Conley RollsLe this weekend or Monday 12/28.

## 2014-03-11 ENCOUNTER — Ambulatory Visit (INDEPENDENT_AMBULATORY_CARE_PROVIDER_SITE_OTHER): Payer: Federal, State, Local not specified - PPO | Admitting: Nurse Practitioner

## 2014-03-11 ENCOUNTER — Encounter: Payer: Self-pay | Admitting: Nurse Practitioner

## 2014-03-11 VITALS — BP 100/70 | HR 85 | Ht 75.0 in | Wt 297.8 lb

## 2014-03-11 DIAGNOSIS — R635 Abnormal weight gain: Secondary | ICD-10-CM

## 2014-03-11 DIAGNOSIS — R0600 Dyspnea, unspecified: Secondary | ICD-10-CM

## 2014-03-11 LAB — BRAIN NATRIURETIC PEPTIDE: Pro B Natriuretic peptide (BNP): 2 pg/mL (ref 0.0–100.0)

## 2014-03-11 LAB — TSH: TSH: 1.28 u[IU]/mL (ref 0.35–4.50)

## 2014-03-11 MED ORDER — FUROSEMIDE 40 MG PO TABS: 40.0000 mg | ORAL_TABLET | Freq: Two times a day (BID) | ORAL | Status: DC | PRN

## 2014-03-11 NOTE — Progress Notes (Signed)
Rennis HardingJames Tavis Jr. Date of Birth: [birth year] Medical Record #[id number]  History of Present Illness: Mr. Antonio Cabrera is seen back today for a post hospital visit - seen for Dr. Anne FuSkains. He is a 58 year old male with chronic leg pain, RLS, hiatal hernia, anxiety/depression and GERD.  [facility] with multiple complaints that included intermittent cheest pain and DOE. He was cathed due to concern for right sided heart failure and had an echo. See below. Coronaries are normal. He was discharged on lasix. Noted to have mildly dilated aorta on the echo.   Comes back today. Here alone. Stopped his lasix since discharge. Says he "has no fluid". Still with lots of GI issues. Tells me repeatedly how bad his stomach is and how he is waiting to get to see [facility] - sounds like he is trying to get his records together. No appointment in the system noted. Short of breath when his stomach hurts. Still with some chest burning. On multiple medicines. Has had a couple of episodes where he felt flushed and his heart was racing. Says he is seeing PCP later today. He is upset about his weight - says he is up about 40 pounds. Wants his thyroid rechecked.   Current Outpatient Prescriptions  Medication Sig Dispense Refill  . diazepam (VALIUM) 10 MG tablet Take 1 tablet (10 mg total) by mouth every 12 (twelve) hours as needed for anxiety. May refill 02/10/14 60 tablet 0  . fluticasone (FLONASE) 50 MCG/ACT nasal spray Place 2 sprays into both nostrils daily. 16 g 0  . gabapentin (NEURONTIN) 100 MG capsule TAKE 3 CAPSULES AT BEDTIME FOR RLS 90 capsule 0  . HYDROcodone-acetaminophen (NORCO) 5-325 MG per tablet Take 1 tablet by mouth every 8 (eight) hours as needed for moderate pain. May fill on 02/10/14 90 tablet 0  . hyoscyamine (LEVSIN, ANASPAZ) 0.125 MG tablet Take 1 tablet (0.125 mg total) by mouth 3 (three) times daily as needed for cramping. 30 tablet 3  . levothyroxine (SYNTHROID, LEVOTHROID) 100 MCG  tablet     . omeprazole (PRILOSEC) 20 MG capsule Take 1 capsule (20 mg total) by mouth daily. 30 capsule 3  . PrednisoLONE Acetate (PRED FORTE OP) Apply to eye 4 (four) times daily.    . QUEtiapine (SEROQUEL) 300 MG tablet Take 1 tablet (300 mg total) by mouth at bedtime. 30 tablet 5  . rOPINIRole (REQUIP) 2 MG tablet Take 1 tablet (2 mg total) by mouth at bedtime. 6 tablet 0  . sucralfate (CARAFATE) 1 G tablet     . traZODone (DESYREL) 50 MG tablet TAKE 1 TABLET (50 MG TOTAL) BY MOUTH AT BEDTIME. 30 tablet 4  . furosemide (LASIX) 40 MG tablet Take 1 tablet (40 mg total) by mouth 2 (two) times daily. (Patient not taking: Reported on 03/11/2014) 60 tablet 3  . sildenafil (VIAGRA) 100 MG tablet Take 0.5 tablets (50 mg total) by mouth daily as needed for erectile dysfunction. Monitor blood pressure, dizziness. (Patient not taking: Reported on 03/11/2014) 5 tablet 0   No current facility-administered medications for this visit.    Allergies  Allergen Reactions  . Antihistamines, Chlorpheniramine-Type Other (See Comments)    Skin crawling, agitation  . Decongestant [Pseudoephedrine] Other (See Comments)    Skin crawling  . Penicillins Rash    Past Medical History  Diagnosis Date  . Arthritis   . Depression   . Allergy   . Hypoglycemia   . GERD (gastroesophageal reflux disease)   .  PTSD (post-traumatic stress disorder)   . RLS (restless legs syndrome)   . Neuromuscular disorder   . Chronic back pain   . Anxiety     Past Surgical History  Procedure Laterality Date  . Cholecystectomy  2000  . Cervical fusion      C4-C6  . Lumbar fusion      L5-S1  . Nissen fundoplication    . Ankle arthroscopy    . Knee arthroscopy    . Hernia repair    . Vasectomy    . Spine surgery      Dr Laymond PurserLeroy Allen ( [place] 58[place]   . Cardiac catheterization Bilateral 01/27/2015    normal cardiac cath  . Left and right heart catheterization with coronary angiogram N/A  01/27/2014    Procedure: LEFT AND RIGHT HEART CATHETERIZATION WITH CORONARY ANGIOGRAM;  Surgeon: Lennette Biharihomas A Kelly, MD;  [facility];  Service: Cardiovascular;  Laterality: N/A;    History  Smoking status  . Never Smoker   Smokeless tobacco  . Never Used    History  Alcohol Use  . Yes    Comment: occasional-socially    Family History  Problem Relation Age of Onset  . Breast cancer Mother   . Cancer Mother   . Stroke Father   . Hypertension Father   . Diabetes Father   . Alzheimer's disease Father   . Alcohol abuse Brother     Review of Systems: The review of systems is per the HPI.  All other systems were reviewed and are negative.  Physical Exam: BP 100/70 mmHg  Pulse 85  Ht 6\' 3"  (1.905 m)  Wt 297 lb 12.8 oz (135.081 kg)  BMI 37.22 kg/m2  SpO2 95% Patient is alert and in no acute distress. Skin is warm and dry. Color is normal.  HEENT is unremarkable. Normocephalic/atraumatic. PERRL. Sclera are nonicteric. Neck is supple. No masses. No JVD. Lungs are clear. Cardiac exam shows a regular rate and rhythm. HR is 88 by me.  Abdomen is obese but soft. Extremities are without edema. Gait and ROM are intact. No gross neurologic deficits noted.  Wt Readings from Last 3 Encounters:  03/11/14 297 lb 12.8 oz (135.081 kg)  01/31/14 288 lb 6.4 oz (130.817 kg)  01/27/14 285 lb 9.6 oz (129.547 kg)    LABORATORY DATA/PROCEDURES:  Lab Results  Component Value Date   WBC 5.5 01/27/2014   HGB 12.1* 01/27/2014   HCT 37.9* 01/27/2014   PLT 259 01/27/2014   GLUCOSE 92 02/04/2014   CHOL 118 01/25/2014   TRIG 55 01/25/2014   HDL 41 01/25/2014   LDLCALC 66 01/25/2014   ALT 28 12/30/2013   AST 28 12/30/2013   NA 140 02/04/2014   K 3.8 02/04/2014   CL 105 02/04/2014   CREATININE 1.1 02/04/2014   BUN 15 02/04/2014   CO2 24 02/04/2014   TSH 1.557 09/09/2013   PSA 2.55 12/04/2013   INR 1.13 01/26/2014   HGBA1C 5.9* 01/25/2014    BNP (last 3 results)  Recent Labs   01/24/14 2029 01/25/14 0422  PROBNP <5.0 7.2    Echocardiogram 11/15 LV EF: 50% -  55%  ------------------------------------------------------------------- Indications:   Chest pain 786.51.  ------------------------------------------------------------------- History:  PMH: unstable angina, acute right heart failure, chest pain, and GERD.  ------------------------------------------------------------------- Study Conclusions  - Left ventricle: Cannot rule out focal distal septal akinesis. The cavity size was normal. There was moderate focal basal hypertrophy. Systolic function was normal.  The estimated ejection fraction was in the range of 50% to 55%. Wall motion was normal; there were no regional wall motion abnormalities. There was an increased relative contribution of atrial contraction to ventricular filling. Doppler parameters are consistent with abnormal left ventricular relaxation (grade 1 diastolic dysfunction). - Ascending aorta: The ascending aorta was mildly dilated. - Right ventricle: Systolic function was mildly reduced.    Cardiac catheterization 11/16 PROCEDURE: Right and left heart catheterization: Swan-Ganz catheterization with cardiac output determinations by thermodilution and Fick method, coronary angiography, left ventriculography.  HEMODYNAMICS:   RA: mean 11 RV: 28/9 PA: 28/14 PC: mean 12 LV: 114/76  AO: 114/76  Oxygen saturation in the aorta 71% and the pulmonary artery 94%  Cardiac output: 7.5 l/min (Thermo); 8.8 (Fick)  Cardiac index: 3.0 l/m/m2 3.5  ANGIOGRAPHY:   Left main: Angiographically normal and bifurcated into the LAD and left circumflex coronary arteries  LAD: Angiographically normal and gave rise to 2 diagonal branches and one septal perforating artery. The vessel extended to the LV apex.  Left circumflex: Large dominant left circumflex vessel which gave rise to 2 marginal vessels  and ended in the PDA and PLA vessel.  Right coronary artery: Angiographically normal small nondominant RCA  Left ventriculography revealed low normal LV function with an ejection fraction of 50-55% without focal segmental wall motion abnormalities. There was no evidence for mitral regurgitation.   IMPRESSION:  Normal coronary arteries.  Low normal LV function with an ejection fraction of 50-55%.  Upper normal right heart pressures.  RECOMMENDATION:  Consider noncardiac etiology to the patient's chest pain.    Assessment / Plan: 1. Right sided heart failure - diuresed - now off of his lasix. Will check BNP - weight is up but he does not look like he has fluid overload.   2. S/P cardiac cath - normal coronaries and low normal EF of 50 to 55% - his chest pain is not felt to be cardiac in nature.  3. Hiatal hernia/GERD - awaiting GI referral  4. Chronic pain syndrome  5. Mildly dilated aorta - will need followed.  6. Heart racing - if persists - would place event monitor.   See back in 4 to 6 months. Echo in a year. I have changed his lasix to just prn. Encouraged him to get to his PCP to discuss his GI referral.   Patient is agreeable to this plan and will call if any problems develop in the interim.   Rosalio Macadamia, RN, ANP-C [facility] [address] [place], [place]  [address] [phone]

## 2014-03-11 NOTE — Patient Instructions (Signed)
We will be checking the following labs today TSH and BNP  Stay on your current medicines  I will change your Lasix to just as needed  See your primary care doctor and talk with him about seeing the GI doctor  See Dr. Anne FuSkains in 4 months  Call the [facility] office at [phone] if you have any questions, problems or concerns.

## 2014-03-12 ENCOUNTER — Telehealth: Payer: Self-pay | Admitting: Gastroenterology

## 2014-03-12 NOTE — Telephone Encounter (Signed)
Records received from previous GI and placed on Jessica Zehr's desk for review

## 2014-03-13 ENCOUNTER — Ambulatory Visit (INDEPENDENT_AMBULATORY_CARE_PROVIDER_SITE_OTHER): Payer: Federal, State, Local not specified - PPO | Admitting: Family Medicine

## 2014-03-13 VITALS — BP 120/70 | HR 87 | Temp 98.1°F | Resp 16 | Ht 74.0 in | Wt 299.0 lb

## 2014-03-13 DIAGNOSIS — F411 Generalized anxiety disorder: Secondary | ICD-10-CM

## 2014-03-13 DIAGNOSIS — G894 Chronic pain syndrome: Secondary | ICD-10-CM

## 2014-03-13 MED ORDER — HYDROCODONE-ACETAMINOPHEN 5-325 MG PO TABS: 1.0000 | ORAL_TABLET | Freq: Three times a day (TID) | ORAL | Status: DC | PRN

## 2014-03-13 MED ORDER — DIAZEPAM 10 MG PO TABS
10.0000 mg | ORAL_TABLET | Freq: Two times a day (BID) | ORAL | Status: DC | PRN
Start: 2014-03-13 — End: 2014-06-24

## 2014-03-13 NOTE — Progress Notes (Signed)
Subjective: Patient of Dr. Nedra HaiLee who is here for recheck and refill of his pain medications. He has been having a lot of problems. He continues to have sleep disturbance. He continues to have chronic pain. He gets only limited exercise around house and yard. He does not work, on disability from old neck and back surgery. He has seen a cardiologist and had a slightly low ejection fraction. He has a hiatal hernia which had 2 Nissen fundoplasty is over the years. He has a lot of epigastric pain radiating up into his chest. He continues to wait for his appointment with a gastroenterologist. He has gained weight steadily, 50 pounds over the last year and a half. He has never weighed this much before, and that is a big concern to him. 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 Heart regular without murmurs. Large epigastric scar. Abdomen soft and tender in the epigastrium. No ankle edema.  Assessment: Weight gain, may be aggravated by the Seroquel. Chronic back pain/chronic pain syndrome Restless legs Obesity Hiatal hernia and epigastric pain Insomnia  Plan: See instructions Long talk about the need to lose weight, that it is affecting all of the systems. I suspect the Seroquel may be adding to that. Advised him to discontinue the Seroquel and to up his trazodone. Did not treat his dyspepsia because he is supposed to see a gastroenterologist sometime. Eyes regular exercise. Long talk about trying to lose weight. If he keeps on having both restless legs and insomnia issues, I think he should have a sleep study done. I did not order that, but asked him to see his regular doctor, Dr. Conley RollsLe back in 1 month.

## 2014-03-13 NOTE — Patient Instructions (Signed)
I recommend you discontinue the Seroquel  Increase the trazodone to 75 mg ( 1 and 1/2 tablet) daily for about 3 days, then up it to 100 mg (250 mg). If you are doing satisfactorily on that call for a prescription of the 100 mg pills  Continue your other medications  Try to increase regular exercise by walking as tolerated, multiple times a day if necessary.  Work very hard on changing her dietary habits. Listen to the advice of the dietitian.  Cut out all liquid calories, drink water or unsweetened tea or other unsweetened beverages. Avoid artificial sweeteners because they tend to actually increase appetite  Cut out snacks  Deserts only on special occasions  At mealtimes try to fill up on lean meats, fruits, vegetables. Take only small portions of starches (breads, pastas, potatoes, grain foods). Whole grains are healthier than processed, so choose brown oval white  Be very cautious about eating out  Weigh yourself daily or every couple of days, and write down the weight. Way in the morning after going to the bathroom while you do not have your clothes on so you get a consistent weight.  Return again in about one month to follow-up with Dr. Conley RollsLe

## 2014-03-19 ENCOUNTER — Ambulatory Visit (AMBULATORY_SURGERY_CENTER): Payer: Self-pay | Admitting: *Deleted

## 2014-03-19 VITALS — Ht 74.0 in | Wt 299.0 lb

## 2014-03-19 DIAGNOSIS — K449 Diaphragmatic hernia without obstruction or gangrene: Secondary | ICD-10-CM

## 2014-03-19 NOTE — Progress Notes (Signed)
No egg or soy allergy  No anesthesia or intubation problems per pt  No diet medications taken  Pt states at the beginning of his PV, "we are going to have a problem if I do not get to talk to the doctor before he shoves a tube down my throat."  I explained that his visit today was with me only.  I offered him an OV with Dr. Rhea BeltonPyrtle on 03-25-14 before his procedure.  I also made him aware that he would speak with Dr. Rhea BeltonPyrtle the day of his procedure before he received any sedation.  He states that he is agreeable to speaking with him the day of his procedure.  Jennye BoroughsNatalie Sechrist RN made aware of this.

## 2014-03-25 ENCOUNTER — Ambulatory Visit: Payer: Federal, State, Local not specified - PPO | Admitting: Internal Medicine

## 2014-03-27 ENCOUNTER — Other Ambulatory Visit: Payer: Self-pay | Admitting: Family Medicine

## 2014-03-28 ENCOUNTER — Encounter: Payer: Self-pay | Admitting: Internal Medicine

## 2014-03-28 ENCOUNTER — Ambulatory Visit (AMBULATORY_SURGERY_CENTER): Payer: Federal, State, Local not specified - PPO | Admitting: Internal Medicine

## 2014-03-28 VITALS — BP 132/85 | HR 75 | Temp 98.2°F | Resp 20 | Ht 74.0 in | Wt 299.0 lb

## 2014-03-28 DIAGNOSIS — R110 Nausea: Secondary | ICD-10-CM

## 2014-03-28 DIAGNOSIS — R1013 Epigastric pain: Secondary | ICD-10-CM

## 2014-03-28 DIAGNOSIS — K449 Diaphragmatic hernia without obstruction or gangrene: Secondary | ICD-10-CM

## 2014-03-28 DIAGNOSIS — K2950 Unspecified chronic gastritis without bleeding: Secondary | ICD-10-CM

## 2014-03-28 DIAGNOSIS — R140 Abdominal distension (gaseous): Secondary | ICD-10-CM

## 2014-03-28 MED ORDER — SODIUM CHLORIDE 0.9 % IV SOLN: 500.0000 mL | INTRAVENOUS | Status: DC

## 2014-03-28 NOTE — Progress Notes (Signed)
Procedure ends, to recovery, report given and VSS. 

## 2014-03-28 NOTE — Op Note (Signed)
[facility] [address] [place], [id number]   ENDOSCOPY PROCEDURE REPORT  PATIENT: Antonio Cabrera, Maurice  MR#: [id number] BIRTHDATE: 09/13/1955 , 58  yrs. old GENDER: male ENDOSCOPIST: Beverley FiedlerJay M Haadi Santellan, MD PROCEDURE DATE:  03/28/2014 PROCEDURE:  EGD w/ biopsy ASA CLASS:     Class III INDICATIONS:  epigastric abdominal pain, nausea, and abdominal bloating, history of Nissen fundoplication x 2. MEDICATIONS: Monitored anesthesia care and Propofol 200 mg IV TOPICAL ANESTHETIC: none  DESCRIPTION OF PROCEDURE: After the risks benefits and alternatives of the procedure were thoroughly explained, informed consent was obtained.  The LB [id number] endoscope was introduced through the mouth and advanced to the second portion of the duodenum , Without limitations.  The instrument was slowly withdrawn as the mucosa was fully examined.   ESOPHAGUS: There was mild, LA Class A esophagitis (One or more mucosal breaks < 5 mm in maximal length) noted just proximal to the GEJ.   The esophagus was otherwise normal.  STOMACH: A Nissen fundoplication was found.  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 There appears to be a small recurrent hiatal hernia.  Gastric body and antrum normal in appearance, multiple biopsies taken to exclude H.  Pylori.  DUODENUM: The duodenal mucosa showed no abnormalities in the bulb and 2nd part of the duodenum.  Retroflexed views revealed as previously described.     The scope was then withdrawn from the patient and the procedure completed.  COMPLICATIONS: There were no immediate complications.      ENDOSCOPIC IMPRESSION: 1.   There was I mild LA Class A reflux esophagitis noted 2.   The esophagus was otherwise normal. 3.   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   Gastric body and antrum normal in appearance, multiple biopsies taken to exclude H.  Pylori 5.   The duodenal mucosa  showed no abnormalities in the bulb and 2nd part of the duodenum  RECOMMENDATIONS: 1.  Await biopsy results 2.  Gastric emptying study 3.  Avoid NSAIDs  eSigned:  Beverley FiedlerJay M [name], MD 03/28/2014 10:19 AM    CC:The Patient. PCP  PATIENT NAME:  Antonio Cabrera, Aison MR#: [id number]

## 2014-03-28 NOTE — Patient Instructions (Signed)
YOU HAD AN ENDOSCOPIC PROCEDURE TODAY AT THE [facility]: Refer to the procedure report that was given to you for any specific questions about what was found during the examination.  If the procedure report does not answer your questions, please call your gastroenterologist to clarify.  If you requested that your care partner not be given the details of your procedure findings, then the procedure report has been included in a sealed envelope for you to review at your convenience later.  YOU SHOULD EXPECT: Some feelings of bloating in the abdomen. Passage of more gas than usual.  Walking can help get rid of the air that was put into your GI tract during the procedure and reduce the bloating. If you had a lower endoscopy (such as a colonoscopy or flexible sigmoidoscopy) you may notice spotting of blood in your stool or on the toilet paper. If you underwent a bowel prep for your procedure, then you may not have a normal bowel movement for a few days.  DIET: Your first meal following the procedure should be a light meal and then it is ok to progress to your normal diet.  A half-sandwich or bowl of soup is an example of a good first meal.  Heavy or fried foods are harder to digest and may make you feel nauseous or bloated.  Likewise meals heavy in dairy and vegetables can cause extra gas to form and this can also increase the bloating.  Drink plenty of fluids but you should avoid alcoholic beverages for 24 hours.  ACTIVITY: Your care partner should take you home directly after the procedure.  You should plan to take it easy, moving slowly for the rest of the day.  You can resume normal activity the day after the procedure however you should NOT DRIVE or use heavy machinery for 24 hours (because of the sedation medicines used during the test).    SYMPTOMS TO REPORT IMMEDIATELY: A gastroenterologist can be reached at any hour.  During normal business hours, 8:30 AM to 5:00 PM Monday through Friday,  call [phone].  After hours and on weekends, please call the GI answering service at [phone] who will take a message and have the physician on call contact you.     Following upper endoscopy (EGD)  Vomiting of blood or coffee ground material  New chest pain or pain under the shoulder blades  Painful or persistently difficult swallowing  New shortness of breath  Fever of 100F or higher  Black, tarry-looking stools  FOLLOW UP: If any biopsies were taken you will be contacted by phone or by letter within the next 1-3 weeks.  Call your gastroenterologist if you have not heard about the biopsies in 3 weeks.  Our staff will call the home number listed on your records the next business day following your procedure to check on you and address any questions or concerns that you may have at that time regarding the information given to you following your procedure. This is a courtesy call and so if there is no answer at the home number and we have not heard from you through the emergency physician on call, we will assume that you have returned to your regular daily activities without incident.  Esophagitis information given.  Gastric emptying study to be scheduled.  Avoid NSAIDS.         SIGNATURES/CONFIDENTIALITY: You and/or your care partner have signed paperwork which will be entered into your electronic medical record.  These signatures  attest to the fact that that the information above on your After Visit Summary has been reviewed and is understood.  Full responsibility of the confidentiality of this discharge information lies with you and/or your care-partner.

## 2014-03-28 NOTE — Progress Notes (Signed)
Called to room to assist during endoscopic procedure.  Patient ID and intended procedure confirmed with present staff. Received instructions for my participation in the procedure from the performing physician.  

## 2014-03-28 NOTE — Progress Notes (Signed)
Patient reports plastic permanent bridges, not removable but will be replaced with permanent bridges.

## 2014-03-31 ENCOUNTER — Telehealth: Payer: Self-pay

## 2014-03-31 ENCOUNTER — Other Ambulatory Visit: Payer: Self-pay

## 2014-03-31 ENCOUNTER — Telehealth: Payer: Self-pay | Admitting: *Deleted

## 2014-03-31 ENCOUNTER — Telehealth: Payer: Self-pay | Admitting: Internal Medicine

## 2014-03-31 DIAGNOSIS — R1084 Generalized abdominal pain: Secondary | ICD-10-CM

## 2014-03-31 NOTE — Telephone Encounter (Signed)
Patient states that he was advised by Dr Rhea BeltonPyrtle that he would call in carafate liquid for him after his EGD. He states that the pills (he is making a slurry) are making him gag. I do not see documentation of this being sent at all, just a note that he was previously on it. Dr Rhea BeltonPyrtle, please advise.Marland Kitchen..Marland Kitchen

## 2014-03-31 NOTE — Telephone Encounter (Signed)
Pt scheduled for [facility] at [facility] 04/08/14@9 :30am. Pt to arrive there at 9:15am. Pt to be NPO after midnight and hold stomach meds after midnight. Pt aware of appt.

## 2014-03-31 NOTE — Telephone Encounter (Signed)
Yes please send liquid carafate TIDAC and HS

## 2014-03-31 NOTE — Telephone Encounter (Signed)
Cream for fever blister -  [facility] [address]

## 2014-03-31 NOTE — Telephone Encounter (Signed)
  Follow up Call-  Call back number 03/28/2014  Post procedure Call Back phone  # [phone]   Permission to leave phone message Yes    voicemail box not set up yet, no message left

## 2014-04-01 MED ORDER — SUCRALFATE 1 GM/10ML PO SUSP: 1.0000 g | Freq: Three times a day (TID) | ORAL | Status: AC

## 2014-04-01 MED ORDER — ACYCLOVIR 5 % EX OINT: 1.0000 "application " | TOPICAL_OINTMENT | CUTANEOUS | Status: DC

## 2014-04-01 NOTE — Telephone Encounter (Signed)
Please call to let him know that it is called in to his pharmacy

## 2014-04-01 NOTE — Telephone Encounter (Signed)
Rx sent. Patient advised. 

## 2014-04-02 ENCOUNTER — Encounter: Payer: Self-pay | Admitting: Internal Medicine

## 2014-04-02 ENCOUNTER — Telehealth: Payer: Self-pay

## 2014-04-02 NOTE — Telephone Encounter (Signed)
Patient is calling to let Dr. Conley RollsLe know that he had surgery on his stomach this past Friday and as a result there's a slipped hernia in his esophogas. Patient is going for mobility this coming Tuesday. Please call patient! [phone]

## 2014-04-02 NOTE — Telephone Encounter (Signed)
Pt.notified

## 2014-04-03 NOTE — Telephone Encounter (Signed)
He is doing as well as can be expected for Antonio Cabrera.

## 2014-04-08 ENCOUNTER — Ambulatory Visit (HOSPITAL_COMMUNITY)
Admission: RE | Admit: 2014-04-08 | Discharge: 2014-04-08 | Disposition: A | Discharge status: Home / Self Care | Payer: Federal, State, Local not specified - PPO | Source: Ambulatory Visit | Attending: Internal Medicine | Admitting: Internal Medicine

## 2014-04-08 DIAGNOSIS — R1084 Generalized abdominal pain: Secondary | ICD-10-CM | POA: Diagnosis present

## 2014-04-08 MED ORDER — TECHNETIUM TC 99M SULFUR COLLOID
2.1000 | Freq: Once | INTRAVENOUS | Status: AC | PRN
  Administered 2014-04-08: 2.1 via ORAL

## 2014-04-09 ENCOUNTER — Telehealth: Payer: Self-pay | Admitting: Internal Medicine

## 2014-04-09 NOTE — Telephone Encounter (Signed)
Pt states he is having lots of bloating and abdominal pain. Pt had GES yesterday and this was normal. Per result note pt may try reglan but would need office visit first. Pt offered an appt for today but states he cannot come. Pt scheduled to see Doug SouJessica Zehr PA tomorrow at 2pm, pt aware of appt.

## 2014-04-10 ENCOUNTER — Emergency Department (HOSPITAL_COMMUNITY): Payer: Federal, State, Local not specified - PPO

## 2014-04-10 ENCOUNTER — Ambulatory Visit: Payer: Federal, State, Local not specified - PPO | Admitting: Gastroenterology

## 2014-04-10 ENCOUNTER — Emergency Department (HOSPITAL_COMMUNITY)
Admission: EM | Admit: 2014-04-10 | Discharge: 2014-04-10 | Disposition: A | Discharge status: Home / Self Care | Payer: Federal, State, Local not specified - PPO | Attending: Emergency Medicine | Admitting: Emergency Medicine

## 2014-04-10 ENCOUNTER — Encounter (HOSPITAL_COMMUNITY): Payer: Self-pay | Admitting: Emergency Medicine

## 2014-04-10 DIAGNOSIS — F419 Anxiety disorder, unspecified: Secondary | ICD-10-CM | POA: Diagnosis not present

## 2014-04-10 DIAGNOSIS — M1990 Unspecified osteoarthritis, unspecified site: Secondary | ICD-10-CM | POA: Diagnosis not present

## 2014-04-10 DIAGNOSIS — W010XXA Fall on same level from slipping, tripping and stumbling without subsequent striking against object, initial encounter: Secondary | ICD-10-CM | POA: Insufficient documentation

## 2014-04-10 DIAGNOSIS — Z880 Allergy status to penicillin: Secondary | ICD-10-CM | POA: Insufficient documentation

## 2014-04-10 DIAGNOSIS — F329 Major depressive disorder, single episode, unspecified: Secondary | ICD-10-CM | POA: Diagnosis not present

## 2014-04-10 DIAGNOSIS — K219 Gastro-esophageal reflux disease without esophagitis: Secondary | ICD-10-CM | POA: Diagnosis not present

## 2014-04-10 DIAGNOSIS — I509 Heart failure, unspecified: Secondary | ICD-10-CM | POA: Insufficient documentation

## 2014-04-10 DIAGNOSIS — Z79899 Other long term (current) drug therapy: Secondary | ICD-10-CM | POA: Diagnosis not present

## 2014-04-10 DIAGNOSIS — Z7952 Long term (current) use of systemic steroids: Secondary | ICD-10-CM | POA: Insufficient documentation

## 2014-04-10 DIAGNOSIS — I252 Old myocardial infarction: Secondary | ICD-10-CM | POA: Diagnosis not present

## 2014-04-10 DIAGNOSIS — G8929 Other chronic pain: Secondary | ICD-10-CM | POA: Insufficient documentation

## 2014-04-10 DIAGNOSIS — Y9389 Activity, other specified: Secondary | ICD-10-CM | POA: Insufficient documentation

## 2014-04-10 DIAGNOSIS — S6991XA Unspecified injury of right wrist, hand and finger(s), initial encounter: Secondary | ICD-10-CM | POA: Insufficient documentation

## 2014-04-10 DIAGNOSIS — Y998 Other external cause status: Secondary | ICD-10-CM | POA: Insufficient documentation

## 2014-04-10 DIAGNOSIS — Z8639 Personal history of other endocrine, nutritional and metabolic disease: Secondary | ICD-10-CM | POA: Diagnosis not present

## 2014-04-10 DIAGNOSIS — Y9289 Other specified places as the place of occurrence of the external cause: Secondary | ICD-10-CM | POA: Diagnosis not present

## 2014-04-10 LAB — CBG MONITORING, ED: GLUCOSE-CAPILLARY: 118 mg/dL — AB (ref 70–99)

## 2014-04-10 MED ORDER — OXYCODONE-ACETAMINOPHEN 5-325 MG PO TABS: 1.0000 | ORAL_TABLET | ORAL | Status: DC | PRN

## 2014-04-10 MED ORDER — OXYCODONE-ACETAMINOPHEN 5-325 MG PO TABS
2.0000 | ORAL_TABLET | Freq: Once | ORAL | Status: AC
  Administered 2014-04-10: 2 via ORAL
  Filled 2014-04-10: qty 2

## 2014-04-10 NOTE — ED Notes (Signed)
Patient transported to X-ray 

## 2014-04-10 NOTE — ED Notes (Signed)
Pt. tripped on a floor fan at home and injured his left wrist , reports pain at right wrist worse with movement /palpation .

## 2014-04-10 NOTE — ED Notes (Signed)
Notified RN of CBG 118 

## 2014-04-10 NOTE — Discharge Instructions (Signed)
Take the prescribed medication as directed. °Follow-up with your primary care physician. °Return to the ED for new or worsening symptoms. ° °

## 2014-04-10 NOTE — ED Provider Notes (Signed)
CSN: [id number]     Arrival date & time 04/10/14  0541 History   None    Chief Complaint  Patient presents with  . Wrist Injury     (Consider location/radiation/quality/duration/timing/severity/associated sxs/prior Treatment) The history is provided by the patient and medical records.   This is a 59 year old male with past medical history significant for arthritis, depression, GERD, PTSD, restless leg syndrome, congestive heart failure, presenting to the ED for right wrist injury.  Patient states he tripped over a floor fan at home causing him to hit his right shoulder onto the wall and landed onto an outstretched right wrist. He denies any head injury or loss of consciousness. Patient has pain along the radial aspect of his right wrist. He denies numbness or paresthesias of his hand or fingers. Patient is right-hand dominant. No intervention tried prior to arrival.  Past Medical History  Diagnosis Date  . Arthritis   . Depression   . Allergy   . Hypoglycemia   . GERD (gastroesophageal reflux disease)   . PTSD (post-traumatic stress disorder)   . RLS (restless legs syndrome)   . Neuromuscular disorder   . Chronic back pain   . Anxiety   . Myocardial infarction     unsure of when  . CHF (congestive heart failure)    Past Surgical History  Procedure Laterality Date  . Cholecystectomy  2000  . Cervical fusion      C4-C6  . Lumbar fusion      L5-S1  . Nissen fundoplication      x2  . Ankle arthroscopy    . Knee arthroscopy    . Hernia repair    . Vasectomy    . Spine surgery      Dr Laymond PurserLeroy Allen ( [place] 59[place]   . Cardiac catheterization Bilateral 01/27/2015    normal cardiac cath  . Left and right heart catheterization with coronary angiogram N/A 01/27/2014    Procedure: LEFT AND RIGHT HEART CATHETERIZATION WITH CORONARY ANGIOGRAM;  Surgeon: Lennette Biharihomas A Kelly, MD;  [facility];  Service: Cardiovascular;  Laterality: N/A;   Family History   Problem Relation Age of Onset  . Breast cancer Mother   . Cancer Mother   . Stroke Father   . Hypertension Father   . Diabetes Father   . Alzheimer's disease Father   . Alcohol abuse Brother   . Colon cancer Neg Hx   . Esophageal cancer Neg Hx   . Rectal cancer Neg Hx   . Stomach cancer Neg Hx    History  Substance Use Topics  . Smoking status: Never Smoker   . Smokeless tobacco: Never Used  . Alcohol Use: Yes     Comment: occasional-socially    Review of Systems  Musculoskeletal: Positive for arthralgias.  All other systems reviewed and are negative.     Allergies  Antihistamines, chlorpheniramine-type; Decongestant; and Penicillins  Home Medications   Prior to Admission medications   Medication Sig Start Date End Date Taking? Authorizing Provider  acyclovir ointment (ZOVIRAX) 5 % Apply 1 application topically every 4 (four) hours while awake. For 4 days 04/01/14   Thao P Le, DO  diazepam (VALIUM) 10 MG tablet Take 1 tablet (10 mg total) by mouth every 12 (twelve) hours as needed for anxiety. May refill 02/10/14 03/13/14   Peyton Najjaravid H Hopper, MD  fluticasone (FLONASE) 50 MCG/ACT nasal spray Place 2 sprays into both nostrils daily. 11/29/13   Thao P Le, DO  furosemide (LASIX) 40 MG tablet Take 1 tablet (40 mg total) by mouth 2 (two) times daily as needed. 03/11/14   Rosalio Macadamia, NP  gabapentin (NEURONTIN) 100 MG capsule TAKE 3 CAPSULES AT BEDTIME FOR RLS 02/02/14   Chelle S Jeffery, PA-C  HYDROcodone-acetaminophen (NORCO) 5-325 MG per tablet Take 1 tablet by mouth every 8 (eight) hours as needed for moderate pain. May fill on 02/10/14 03/13/14   Peyton Najjar, MD  hyoscyamine (LEVSIN, ANASPAZ) 0.125 MG tablet Take 1 tablet (0.125 mg total) by mouth 3 (three) times daily as needed for cramping. 02/05/13   Lorre Munroe, NP  LOTEMAX 0.5 % GEL  03/13/14   Historical Provider, MD  omeprazole (PRILOSEC) 20 MG capsule Take 1 capsule (20 mg total) by mouth daily. 11/29/13   Thao  P Le, DO  PrednisoLONE Acetate (PRED FORTE OP) Apply to eye 4 (four) times daily.    Historical Provider, MD  rOPINIRole (REQUIP) 2 MG tablet Take 1 tablet (2 mg total) by mouth at bedtime. 12/07/13   Thao P Le, DO  rOPINIRole (REQUIP) 2 MG tablet Take 1 tablet (2 mg total) by mouth 2 (two) times daily. PATIENT NEEDS OFFICE VISIT FOR ADDITIONAL REFILLS 03/27/14   Thao P Le, DO  sildenafil (VIAGRA) 100 MG tablet Take 0.5 tablets (50 mg total) by mouth daily as needed for erectile dysfunction. Monitor blood pressure, dizziness. Patient not taking: Reported on 03/28/2014 12/30/13   Thao P Le, DO  sucralfate (CARAFATE) 1 GM/10ML suspension Take 10 mLs (1 g total) by mouth 4 (four) times daily -  before meals and at bedtime. 04/01/14   Beverley Fiedler, MD  traZODone (DESYREL) 50 MG tablet TAKE 1 TABLET (50 MG TOTAL) BY MOUTH AT BEDTIME. 01/27/14   Thao P Le, DO   BP 103/76 mmHg  Pulse 68  Temp(Src) 97.2 F (36.2 C) (Oral)  Resp 14  Ht  (1.905 m)  Wt 280 lb (127.007 kg)  BMI 35.00 kg/m2  SpO2 100%   Physical Exam  Constitutional: He is oriented to person, place, and time. He appears well-developed and well-nourished.  HENT:  Head: Normocephalic and atraumatic.  Mouth/Throat: Oropharynx is clear and moist.  Eyes: Conjunctivae and EOM are normal. Pupils are equal, round, and reactive to light.  Neck: Normal range of motion.  Cardiovascular: Normal rate, regular rhythm and normal heart sounds.   Pulmonary/Chest: Effort normal and breath sounds normal. No respiratory distress. He has no wheezes.  Musculoskeletal: Normal range of motion.       Right wrist: He exhibits tenderness and bony tenderness. He exhibits no deformity.  Right wrist with tenderness along radial aspect, mild swelling noted without bony deformity, limited range of motion secondary to pain, freely moving all fingers without difficulty, strong radial pulse and cap refill, sensation intact diffusely throughout hand and all fingers   Neurological: He is alert and oriented to person, place, and time.  Skin: Skin is warm and dry.  Psychiatric: He has a normal mood and affect.  Nursing note and vitals reviewed.   ED Course  Procedures (including critical care time) Labs Review Labs Reviewed - No data to display  Imaging Review Dg Wrist Complete Right  04/10/2014   CLINICAL DATA:  Acute right wrist pain after fall today. Initial encounter.  EXAM: RIGHT WRIST - COMPLETE 3+ VIEW  COMPARISON:  None.  FINDINGS: There is no evidence of fracture or dislocation. There is no evidence of arthropathy or other focal bone  abnormality. Soft tissues are unremarkable.  IMPRESSION: Normal right wrist.   Electronically Signed   By: Roque Lias M.D.   On: 04/10/2014 07:12     EKG Interpretation None      MDM   Final diagnoses:  Right wrist injury, initial encounter   59 year old male with right wrist injury. On exam he has tenderness of radial aspect of right wrist. Hand remains neurovascularly intact. Imaging negative for acute bony abnormality. Wrist splint applied for comfort, short supply of pain medicine for home. Patient to follow with his primary care physician.  Discussed plan with patient, he/she acknowledged understanding and agreed with plan of care.  Return precautions given for new or worsening symptoms.  Garlon Hatchet, PA-C 04/10/14 1610  Dione Booze, MD 04/10/14 [phone]

## 2014-04-11 ENCOUNTER — Telehealth: Payer: Self-pay | Admitting: Family Medicine

## 2014-04-11 ENCOUNTER — Ambulatory Visit: Payer: Federal, State, Local not specified - PPO | Admitting: Family Medicine

## 2014-04-11 NOTE — Telephone Encounter (Signed)
Spoke with pt, he states he had a wrist injury 4 years ago on his right wrist. He thinks he flared up his wrist again. I advised him to try to come to his appt to discuss but he states he cannot keep his head out of the toilet because he is vomiting so much. I advised him to come into the walk in clinic to been.seen but he refused. I also advised him that he may have to come in to evaluate his rash but still insisted on dermatology referral. Please advise.

## 2014-04-11 NOTE — Telephone Encounter (Signed)
Patient cancelled his appointment for today (04/11/2014 @ 1:45). Patient states that he needs to see an orthopaedic surgeon "like yesterday" because he has hurt his wrist again. He also states that he has some dry flaky patches on his back that keep bleeding. He wants to see a dermatologist. Please advise.   [phone]

## 2014-04-11 NOTE — Telephone Encounter (Signed)
Please refer to [facility].

## 2014-04-14 ENCOUNTER — Ambulatory Visit (INDEPENDENT_AMBULATORY_CARE_PROVIDER_SITE_OTHER): Payer: Federal, State, Local not specified - PPO | Admitting: Family Medicine

## 2014-04-14 VITALS — BP 128/74 | HR 87 | Temp 98.3°F | Resp 16 | Ht 74.0 in | Wt 290.0 lb

## 2014-04-14 DIAGNOSIS — F4310 Post-traumatic stress disorder, unspecified: Secondary | ICD-10-CM | POA: Diagnosis not present

## 2014-04-14 DIAGNOSIS — F418 Other specified anxiety disorders: Secondary | ICD-10-CM

## 2014-04-14 DIAGNOSIS — G2581 Restless legs syndrome: Secondary | ICD-10-CM

## 2014-04-14 DIAGNOSIS — K449 Diaphragmatic hernia without obstruction or gangrene: Secondary | ICD-10-CM | POA: Diagnosis not present

## 2014-04-14 DIAGNOSIS — F419 Anxiety disorder, unspecified: Secondary | ICD-10-CM

## 2014-04-14 DIAGNOSIS — L989 Disorder of the skin and subcutaneous tissue, unspecified: Secondary | ICD-10-CM | POA: Diagnosis not present

## 2014-04-14 DIAGNOSIS — F32A Depression, unspecified: Secondary | ICD-10-CM

## 2014-04-14 DIAGNOSIS — H578 Other specified disorders of eye and adnexa: Secondary | ICD-10-CM

## 2014-04-14 DIAGNOSIS — H5789 Other specified disorders of eye and adnexa: Secondary | ICD-10-CM

## 2014-04-14 DIAGNOSIS — G894 Chronic pain syndrome: Secondary | ICD-10-CM

## 2014-04-14 DIAGNOSIS — F329 Major depressive disorder, single episode, unspecified: Secondary | ICD-10-CM

## 2014-04-14 MED ORDER — HYDROCODONE-ACETAMINOPHEN 5-325 MG PO TABS: 1.0000 | ORAL_TABLET | Freq: Three times a day (TID) | ORAL | Status: DC | PRN

## 2014-04-14 NOTE — Progress Notes (Signed)
Chief Complaint:  Chief Complaint  Patient presents with  . Follow-up    stomach  . Eye Pain    both  . Hand Injury    1 x week    HPI: Antonio HardingJames Mcroy Jr. is a 59 y.o. male who is here for   Nausea and vomiting, not getting sleep, he was dry heaving. He was supposed to see PA for GI and did not make it to the appt due his n/v He has not seen Dr Lorenda IshiharaMojeed for neuropsych referral, he hasFederal blue cross blue shield , he states their office would not take his insurance He has had right wrist pain, he fell and went to ER. They did a workup and it was negative.  Went to [facility], he had the same sxs in his left eye, he has seen optometry, he was given loteprednol and also prenidolone acetate , states it does not help and also he ios using refresh. Feels itchy   Past Medical History  Diagnosis Date  . Arthritis   . Depression   . Allergy   . Hypoglycemia   . GERD (gastroesophageal reflux disease)   . PTSD (post-traumatic stress disorder)   . RLS (restless legs syndrome)   . Neuromuscular disorder   . Chronic back pain   . Anxiety   . Myocardial infarction     unsure of when  . CHF (congestive heart failure)    Past Surgical History  Procedure Laterality Date  . Cholecystectomy  2000  . Cervical fusion      C4-C6  . Lumbar fusion      L5-S1  . Nissen fundoplication      x2  . Ankle arthroscopy    . Knee arthroscopy    . Hernia repair    . Vasectomy    . Spine surgery      Dr Laymond PurserLeroy Allen ( [place] 59[place]   . Cardiac catheterization Bilateral 01/27/2015    normal cardiac cath  . Left and right heart catheterization with coronary angiogram N/A 01/27/2014    Procedure: LEFT AND RIGHT HEART CATHETERIZATION WITH CORONARY ANGIOGRAM;  Surgeon: Lennette Biharihomas A Kelly, MD;  [facility];  Service: Cardiovascular;  Laterality: N/A;   History   Social History  . Marital Status: Married    Spouse Name: N/A    Number of Children: N/A  .  Years of Education: 16   Occupational History  . Retired     Retired [employer]   Social History Main Topics  . Smoking status: Never Smoker   . Smokeless tobacco: Never Used  . Alcohol Use: Yes     Comment: occasional-socially  . Drug Use: No  . Sexual Activity: Yes    Birth Control/ Protection: Surgical   Other Topics Concern  . None   Social History Narrative   Family History  Problem Relation Age of Onset  . Breast cancer Mother   . Cancer Mother   . Stroke Father   . Hypertension Father   . Diabetes Father   . Alzheimer's disease Father   . Alcohol abuse Brother   . Colon cancer Neg Hx   . Esophageal cancer Neg Hx   . Rectal cancer Neg Hx   . Stomach cancer Neg Hx    Allergies  Allergen Reactions  . Antihistamines, Chlorpheniramine-Type Other (See Comments)    Skin crawling, agitation  . Decongestant [Pseudoephedrine] Other (See Comments)    Skin  crawling  . Milk-Related Compounds Other (See Comments)    Lactose intolerant  . Penicillins Rash   Prior to Admission medications   Medication Sig Start Date End Date Taking? Authorizing Provider  acyclovir ointment (ZOVIRAX) 5 % Apply 1 application topically every 4 (four) hours while awake. For 4 days 04/01/14  Yes Raekwon Winkowski P Mayela Bullard, DO  diazepam (VALIUM) 10 MG tablet Take 1 tablet (10 mg total) by mouth every 12 (twelve) hours as needed for anxiety. May refill 02/10/14 03/13/14  Yes Peyton Najjar, MD  fluticasone (FLONASE) 50 MCG/ACT nasal spray Place 2 sprays into both nostrils daily. 11/29/13  Yes Johnson Arizola P Waver Dibiasio, DO  furosemide (LASIX) 40 MG tablet Take 1 tablet (40 mg total) by mouth 2 (two) times daily as needed. 03/11/14  Yes Rosalio Macadamia, NP  gabapentin (NEURONTIN) 100 MG capsule TAKE 3 CAPSULES AT BEDTIME FOR RLS 02/02/14  Yes Chelle S Jeffery, PA-C  HYDROcodone-acetaminophen (NORCO) 5-325 MG per tablet Take 1 tablet by mouth every 8 (eight) hours as needed for moderate pain. May fill on 02/10/14 03/13/14  Yes  Peyton Najjar, MD  hyoscyamine (LEVSIN, ANASPAZ) 0.125 MG tablet Take 1 tablet (0.125 mg total) by mouth 3 (three) times daily as needed for cramping. 02/05/13  Yes Lorre Munroe, NP  LOTEMAX 0.5 % GEL Place 1 application into both eyes at bedtime.  03/13/14  Yes Historical Provider, MD  omeprazole (PRILOSEC) 20 MG capsule Take 1 capsule (20 mg total) by mouth daily. 11/29/13  Yes Jshon Ibe P Signa Cheek, DO  PrednisoLONE Acetate (PRED FORTE OP) Apply to eye 4 (four) times daily.   Yes Historical Provider, MD  rOPINIRole (REQUIP) 2 MG tablet Take 1 tablet (2 mg total) by mouth at bedtime. 12/07/13  Yes Tashima Scarpulla P Adya Wirz, DO  rOPINIRole (REQUIP) 2 MG tablet Take 1 tablet (2 mg total) by mouth 2 (two) times daily. PATIENT NEEDS OFFICE VISIT FOR ADDITIONAL REFILLS 03/27/14  Yes Audray Rumore P Kymari Lollis, DO  sildenafil (VIAGRA) 100 MG tablet Take 0.5 tablets (50 mg total) by mouth daily as needed for erectile dysfunction. Monitor blood pressure, dizziness. 12/30/13  Yes Joniel Graumann P Geoffrey Hynes, DO  sucralfate (CARAFATE) 1 GM/10ML suspension Take 10 mLs (1 g total) by mouth 4 (four) times daily -  before meals and at bedtime. 04/01/14  Yes Beverley Fiedler, MD  traZODone (DESYREL) 50 MG tablet TAKE 1 TABLET (50 MG TOTAL) BY MOUTH AT BEDTIME. 01/27/14  Yes Danne Vasek P Shyah Cadmus, DO     ROS: The patient denies fevers, chills, night sweats, unintentional weight loss, chest pain, palpitations, wheezing, dyspnea on exertion,  dysuria, hematuria, melena, numbness, weakness, or tingling.  All other systems have been reviewed and were otherwise negative with the exception of those mentioned in the HPI and as above.    PHYSICAL EXAM: Filed Vitals:   04/14/14 1414  BP: 128/74  Pulse: 87  Temp: 98.3 F (36.8 C)  Resp: 16   Filed Vitals:   04/14/14 1414  Height:  (1.88 m)  Weight: 290 lb (131.543 kg)   Body mass index is 37.22 kg/(m^2).  General: Alert, no acute distress HEENT:  Normocephalic, atraumatic, oropharynx patent. EOMI, PERRLA Cardiovascular:  Regular  rate and rhythm, no rubs murmurs or gallops.  No Carotid bruits, radial pulse intact. No pedal edema.  Respiratory: Clear to auscultation bilaterally.  No wheezes, rales, or rhonchi.  No cyanosis, no use of accessory musculature GI: No organomegaly, abdomen is soft and non-tender, positive bowel sounds.  No masses. Skin: + SKs Neurologic: Facial musculature symmetric. Psychiatric: Patient is appropriate throughout our interaction. Lymphatic: No cervical lymphadenopathy Musculoskeletal: Gait intact. Right wrist-full ROm but painful, + tenderness, good strength   LABS: Results for orders placed or performed during the hospital encounter of 04/10/14  CBG monitoring, ED  Result Value Ref Range   Glucose-Capillary 118 (H) 70 - 99 mg/dL     EKG/XRAY:   Primary read interpreted by Dr. Conley Rolls at [facility].   ASSESSMENT/PLAN: Encounter Diagnoses  Name Primary?  . Chronic pain syndrome Yes  . Eye irritation   . Hiatal hernia   . PTSD (post-traumatic stress disorder)   . Restless leg syndrome   . Anxiety and depression   . Skin lesions, generalized    Will call Dr Inda Merlin office to see if they have another referral to a neuropsychiatrist who will see him Cont with ointments given by Dr Sharlot Gowda, advise to use genteal drops Will get records, may refer to optho prn Refilled pain meds x 1 mont, he can get another refill in 1 month but needs to call me 1 week in advance to get rx, then follow-up in 2 months. He ahs these SKs on his body that he wants removed, nothing looks malignant so far. Refer to dermatology Cont with wrist brace for wrist pain, hopefull the tincture of time will help him. He does not need more pain meds.  Advise to call GI to schedule f/u appt for hiatal hernia and n/v and if they want to start him on reglan or not. He has nto had any vomiting sicne Saturday F/u in 2 months  Gross sideeffects, risk and benefits, and alternatives of medications d/w patient. Patient is aware that  all medications have potential sideeffects and we are unable to predict every sideeffect or drug-drug interaction that may occur.  Lauriana Denes PHUONG, DO 04/14/2014 3:11 PM

## 2014-04-21 ENCOUNTER — Other Ambulatory Visit: Payer: Self-pay | Admitting: Family Medicine

## 2014-04-25 ENCOUNTER — Telehealth: Payer: Self-pay

## 2014-04-25 NOTE — Telephone Encounter (Signed)
The patient called about his prescription for traZODone (DESYREL) 50 MG tablet.  He said one of his providers said that his prescription was to be increased to 3 times a day.  He said that it was not at the pharmacy, and he planned to pick up his other prescriptions at 8pm tonight (04/25/14).  I told him that it might not be ready tonight because it may take a couple days to process the order and send the script.  I checked his records, and I did not see a recent order for this medication.  Please advise.  Thank you.  CB#: [phone]

## 2014-04-26 NOTE — Telephone Encounter (Signed)
Pt called again. Says he is completely out of Trazodone because Dr. Conley RollsLe increased him from 50mg  qd to tid. Do not see any documentation of this. Can we RF for him a few tabs until Dr. Conley RollsLe is back in the office to review?

## 2014-04-26 NOTE — Telephone Encounter (Signed)
Dr. Conley RollsLe, didyou want to increase his dosing?

## 2014-04-27 MED ORDER — TRAZODONE HCL 50 MG PO TABS: 50.0000 mg | ORAL_TABLET | Freq: Three times a day (TID) | ORAL | Status: DC

## 2014-04-27 NOTE — Telephone Encounter (Signed)
I have refilled a month of this medication for the patient.  I do not see this documentation but if that is what he has said he is taking we should not allow him to run out.  I am also going to send to Dr Conley RollsLe to make sure this is what she was hoping for him to do.

## 2014-04-27 NOTE — Telephone Encounter (Signed)
Dr. Conley RollsLe, unfortunately the answering service [facility] and we were unable to see where you had spoke to the pt, nor did I see where a new rx was e-scribed to the pharm. I apologize that this was done twice and that you were bothered in [place]. I will call pharm and cancel duplicate message.

## 2014-04-27 NOTE — Telephone Encounter (Signed)
Thanks Sara--I answered the call last night in [place], He is apparently doing well on the  increase dose of trazodone. He can sleep now.I called in rx for trazodone, the pharmacy should have gotten a called in rx for Trazodone 50 mg take 2-3 tabs PO prn qhs.Dispense #90, no Refills  Erin---Perhaps we an give this to the [employer] people if this is a flow problem they want to investigate since we get lots of extra effort and duplicates. Perhaps they can look at the stream of calls and see a solutio for this. I received the call from the answering service at 3:04 Pacific standard  time on 04/26/14 , so it would be EST 6:04 pm--total call time was 55 seconds I spoke with the patient at 3:07 PST,   It was a 3 min conversation I called in the rx at 3:11 PST , it was a 1 min conversation   Erin--Can you also call the pharmacy and ask them to only dispense one of the rx , he can take up to 150 mg nightly prn , #90. He is someone I really want to be careful with polypharmacy . T  Thanks!!

## 2014-04-28 ENCOUNTER — Telehealth: Payer: Self-pay | Admitting: Cardiology

## 2014-04-28 MED ORDER — TORSEMIDE 10 MG PO TABS: ORAL_TABLET | ORAL | Status: DC

## 2014-04-28 NOTE — Telephone Encounter (Signed)
I was not bothered, I actually don;t mind the calls from folks I know. I  just wanted to see if [employer] can help with this. So can you forward this to them. Thanks

## 2014-04-28 NOTE — Telephone Encounter (Signed)
Pt c/o Shortness Of Breath: STAT if SOB developed within the last 24 hours or pt is noticeably SOB on the phone  1. Are you currently SOB (can you hear that pt is SOB on the phone)? Cant hear it on the phone but he says that he is.  2. How long have you been experiencing SOB? Within 24 hours 3. Are you SOB when sitting or when up moving around? Moving around  4.  Are you currently experiencing any other symptoms?  pt reports both of his legs are very "fat"he says it looks like "tree stumps" . When he touches them, he can create a dimple.

## 2014-04-28 NOTE — Telephone Encounter (Signed)
Calling stating that his legs below his knees have been very swollen x 3-4 days.  When he touches them leaves a "dimple" in the skin.  Is SOB when climbs up stairs or does any activities.  No CP but has some chest tightness.  States he is having some palpitations and irregular HR. States is not taking the Lasix because it causes him to be dizzy.  Reviewed diet with him and states he has been watching salt intake. Spoke w/Lori Gerhardt,NP (flex) who suggests that he take Demadex 10 mg x 3 days and see Dr. Anne FuSkains.  Dr. Anne FuSkains had an appointment for Thursday 2/18 at 2:30.  [facility] Dr. Anne Fu[facility]ains on Thursday.  [facility]

## 2014-04-29 NOTE — Telephone Encounter (Signed)
I printed this enc off and [facility]

## 2014-05-01 ENCOUNTER — Ambulatory Visit (INDEPENDENT_AMBULATORY_CARE_PROVIDER_SITE_OTHER): Payer: Federal, State, Local not specified - PPO | Admitting: Cardiology

## 2014-05-01 ENCOUNTER — Encounter: Payer: Self-pay | Admitting: Cardiology

## 2014-05-01 VITALS — BP 110/78 | HR 92 | Ht 74.0 in | Wt 305.0 lb

## 2014-05-01 DIAGNOSIS — R609 Edema, unspecified: Secondary | ICD-10-CM

## 2014-05-01 DIAGNOSIS — E8779 Other fluid overload: Secondary | ICD-10-CM

## 2014-05-01 DIAGNOSIS — E669 Obesity, unspecified: Secondary | ICD-10-CM

## 2014-05-01 LAB — URINALYSIS
Bilirubin Urine: NEGATIVE
HGB URINE DIPSTICK: NEGATIVE
KETONES UR: NEGATIVE
Leukocytes, UA: NEGATIVE
NITRITE: NEGATIVE
Specific Gravity, Urine: 1.02 (ref 1.000–1.030)
TOTAL PROTEIN, URINE-UPE24: NEGATIVE
Urine Glucose: NEGATIVE
Urobilinogen, UA: 0.2 (ref 0.0–1.0)
pH: 7.5 (ref 5.0–8.0)

## 2014-05-01 MED ORDER — TORSEMIDE 10 MG PO TABS: ORAL_TABLET | ORAL | Status: DC

## 2014-05-01 MED ORDER — POTASSIUM CHLORIDE CRYS ER 20 MEQ PO TBCR: 20.0000 meq | EXTENDED_RELEASE_TABLET | Freq: Every day | ORAL | Status: AC

## 2014-05-01 NOTE — Patient Instructions (Addendum)
Please start Demedex 10 mg a day. Start potassium chloride 20 MEQ a day. Continue all other medications as listed.  Please have a urinalysis today.  Return in 1 week to see Dr Anne FuSkains, recheck weight and BMP.  Thank you for choosing [facility]!!

## 2014-05-01 NOTE — Progress Notes (Signed)
Antonio HardingJames Keough Jr. Date of Birth: 03-23-1955 Medical Record #[id number]  History of Present Illness: Mr. Antonio Cabrera is seen back today for a follow-up. He is a 59 year old male with chronic leg pain, RLS, hiatal hernia, anxiety/depression and GERD.  [facility] that included intermittent cheest pain and DOE. He was cathed due to concern for right sided heart failure and had an echo. See below. Coronaries are normal. He was discharged on lasix. Noted to have mildly dilated aorta on the echo.   His primary physician gave him 10 mg of Demadex for 3 days which helped him urinate quite a bit. He comes in today asking the question why he is still holding on to fluid. He has not had a urinalysis performed. Reassuring right heart catheterization, echocardiogram, BNP.  Still with lots of GI issues. Tells me repeatedly how bad his stomach is. He did not want to take Lasix because it made his head spin.  Current Outpatient Prescriptions  Medication Sig Dispense Refill  . diazepam (VALIUM) 10 MG tablet Take 1 tablet (10 mg total) by mouth every 12 (twelve) hours as needed for anxiety. May refill 02/10/14 60 tablet 2  . fluticasone (FLONASE) 50 MCG/ACT nasal spray Place 2 sprays into both nostrils daily. 16 g 0  . gabapentin (NEURONTIN) 100 MG capsule TAKE 3 CAPSULES AT BEDTIME FOR RLS 90 capsule 0  . HYDROcodone-acetaminophen (NORCO) 5-325 MG per tablet Take 1 tablet by mouth every 8 (eight) hours as needed for moderate pain. 90 tablet 0  . hyoscyamine (LEVSIN, ANASPAZ) 0.125 MG tablet Take 1 tablet (0.125 mg total) by mouth 3 (three) times daily as needed for cramping. 30 tablet 3  . omeprazole (PRILOSEC) 20 MG capsule Take 1 capsule (20 mg total) by mouth daily. 30 capsule 3  . rOPINIRole (REQUIP) 2 MG tablet Take 1 tablet (2 mg total) by mouth at bedtime. 6 tablet 0  . sucralfate (CARAFATE) 1 GM/10ML suspension Take 10 mLs (1 g total) by mouth 4 (four) times  daily -  before meals and at bedtime. 420 mL 1  . torsemide (DEMADEX) 10 MG tablet Take one tablet daily x 3 days only 3 tablet 0  . traZODone (DESYREL) 50 MG tablet Take 1 tablet (50 mg total) by mouth 3 (three) times daily. 90 tablet 0   No current facility-administered medications for this visit.    Allergies  Allergen Reactions  . Antihistamines, Chlorpheniramine-Type Other (See Comments)    Skin crawling, agitation  . Decongestant [Pseudoephedrine] Other (See Comments)    Skin crawling  . Milk-Related Compounds Other (See Comments)    Lactose intolerant  . Penicillins Rash    Past Medical History  Diagnosis Date  . Arthritis   . Depression   . Allergy   . Hypoglycemia   . GERD (gastroesophageal reflux disease)   . PTSD (post-traumatic stress disorder)   . RLS (restless legs syndrome)   . Neuromuscular disorder   . Chronic back pain   . Anxiety   . Myocardial infarction     unsure of when  . CHF (congestive heart failure)     Past Surgical History  Procedure Laterality Date  . Cholecystectomy  2000  . Cervical fusion      C4-C6  . Lumbar fusion      L5-S1  . Nissen fundoplication      x2  . Ankle arthroscopy    . Knee arthroscopy    . Hernia  repair    . Vasectomy    . Spine surgery      Dr Laymond Purser ( decease-neurosurgeon in [place] age 59)   . Cardiac catheterization Bilateral 01/27/2015    normal cardiac cath  . Left and right heart catheterization with coronary angiogram N/A 01/27/2014    Procedure: LEFT AND RIGHT HEART CATHETERIZATION WITH CORONARY ANGIOGRAM;  Surgeon: Lennette Bihari, MD;  Location: [facility] CATH LAB;  Service: Cardiovascular;  Laterality: N/A;    History  Smoking status  . Never Smoker   Smokeless tobacco  . Never Used    History  Alcohol Use  . Yes    Comment: occasional-socially    Family History  Problem Relation Age of Onset  . Breast cancer Mother   . Cancer Mother   . Stroke Father   . Hypertension Father   .  Diabetes Father   . Alzheimer's disease Father   . Alcohol abuse Brother   . Colon cancer Neg Hx   . Esophageal cancer Neg Hx   . Rectal cancer Neg Hx   . Stomach cancer Neg Hx     Review of Systems: The review of systems is per the HPI.  All other systems were reviewed and are negative.  Physical Exam: BP 110/78 mmHg  Pulse 92  Ht  (1.88 m)  Wt 305 lb (138.347 kg)  BMI 39.14 kg/m2 Patient is alert and in no acute distress. Skin is warm and dry. Color is normal.  HEENT is unremarkable. Normocephalic/atraumatic. PERRL. Sclera are nonicteric. Neck is supple. No masses. No JVD. Lungs are clear. Cardiac exam shows a regular rate and rhythm. HR is 88 by me.  Abdomen is obese but soft. Extremities are with 2+ bilateral edema. Gait and ROM are intact. No gross neurologic deficits noted.  Wt Readings from Last 3 Encounters:  05/01/14 305 lb (138.347 kg)  04/14/14 290 lb (131.543 kg)  04/10/14 280 lb (127.007 kg)    LABORATORY DATA/PROCEDURES:  Lab Results  Component Value Date   WBC 5.5 01/27/2014   HGB 12.1* 01/27/2014   HCT 37.9* 01/27/2014   PLT 259 01/27/2014   GLUCOSE 92 02/04/2014   CHOL 118 01/25/2014   TRIG 55 01/25/2014   HDL 41 01/25/2014   LDLCALC 66 01/25/2014   ALT 28 12/30/2013   AST 28 12/30/2013   NA 140 02/04/2014   K 3.8 02/04/2014   CL 105 02/04/2014   CREATININE 1.1 02/04/2014   BUN 15 02/04/2014   CO2 24 02/04/2014   TSH 1.28 03/11/2014   PSA 2.55 12/04/2013   INR 1.13 01/26/2014   HGBA1C 5.9* 01/25/2014    BNP (last 3 results)  Recent Labs  01/24/14 2029 01/25/14 0422 03/11/14 1418  PROBNP <5.0 7.2 2.0    Echocardiogram 11/15 LV EF: 50% -  55%  ------------------------------------------------------------------- Indications:   Chest pain 786.51.  ------------------------------------------------------------------- History:  PMH: unstable angina, acute right heart failure, chest pain, and  GERD.  ------------------------------------------------------------------- Study Conclusions  - Left ventricle: Cannot rule out focal distal septal akinesis. The cavity size was normal. There was moderate focal basal hypertrophy. Systolic function was normal. The estimated ejection fraction was in the range of 50% to 55%. Wall motion was normal; there were no regional wall motion abnormalities. There was an increased relative contribution of atrial contraction to ventricular filling. Doppler parameters are consistent with abnormal left ventricular relaxation (grade 1 diastolic dysfunction). - Ascending aorta: The ascending aorta was mildly dilated. - Right ventricle: Systolic  function was mildly reduced.    Cardiac catheterization 11/16 PROCEDURE: Right and left heart catheterization: Swan-Ganz catheterization with cardiac output determinations by thermodilution and Fick method, coronary angiography, left ventriculography.  HEMODYNAMICS:   RA: mean 11 RV: 28/9 PA: 28/14 PC: mean 12 LV: 114/76  AO: 114/76  Oxygen saturation in the aorta 71% and the pulmonary artery 94%  Cardiac output: 7.5 l/min (Thermo); 8.8 (Fick)  Cardiac index: 3.0 l/m/m2 3.5  ANGIOGRAPHY:   Left main: Angiographically normal and bifurcated into the LAD and left circumflex coronary arteries  LAD: Angiographically normal and gave rise to 2 diagonal branches and one septal perforating artery. The vessel extended to the LV apex.  Left circumflex: Large dominant left circumflex vessel which gave rise to 2 marginal vessels and ended in the PDA and PLA vessel.  Right coronary artery: Angiographically normal small nondominant RCA  Left ventriculography revealed low normal LV function with an ejection fraction of 50-55% without focal segmental wall motion abnormalities. There was no evidence for mitral regurgitation.   IMPRESSION:  Normal coronary arteries.  Low  normal LV function with an ejection fraction of 50-55%.  Upper normal right heart pressures.  RECOMMENDATION:  Consider noncardiac etiology to the patient's chest pain.    Assessment / Plan: 1. Fluid overload-he clearly has edema on his lower extremities today. He is questioning why he is holding on the fluid. Part of this fluid gain is also weight gain from caloric intake. Discussed decreasing carbohydrates with him. Nonetheless, he states that he diuresed 7 L while in the hospital. I will give him Demadex 10 mg once a day with potassium 20 mEq daily. Encouraged him to use this medication. Encouraged him to fluid restrict. Thankfully, BNP was normal, echo was normal, cardiac catheterization was normal, right heart catheterization was essentially normal. I will also check a urinalysis to ensure that he does not have any protein in his urine, i.e. nephrotic syndrome which could cause retention of fluid.  2. S/P cardiac cath - normal coronaries and low normal EF of 50 to 55% - his chest pain is not felt to be cardiac in nature.  3. Hiatal hernia/GERD - GI   4. Chronic pain syndrome  5. Mildly dilated aorta - will need followed.   See back in one week. Echo in a year. Check a basic metabolic profile in one week. Patient is agreeable to this plan and will call if any problems develop in the interim.   Donato Schultz, MD  [facility] [address] [place], [place]  [address] [phone]

## 2014-05-07 ENCOUNTER — Ambulatory Visit: Payer: Federal, State, Local not specified - PPO | Admitting: Cardiology

## 2014-05-13 ENCOUNTER — Encounter: Payer: Self-pay | Admitting: Cardiology

## 2014-05-20 ENCOUNTER — Other Ambulatory Visit: Payer: Self-pay | Admitting: Physician Assistant

## 2014-05-21 ENCOUNTER — Ambulatory Visit (INDEPENDENT_AMBULATORY_CARE_PROVIDER_SITE_OTHER): Payer: Federal, State, Local not specified - PPO | Admitting: Family Medicine

## 2014-05-21 VITALS — BP 108/78 | HR 73 | Temp 97.5°F | Resp 18 | Ht 75.0 in | Wt 302.0 lb

## 2014-05-21 DIAGNOSIS — D649 Anemia, unspecified: Secondary | ICD-10-CM | POA: Diagnosis not present

## 2014-05-21 DIAGNOSIS — G2581 Restless legs syndrome: Secondary | ICD-10-CM | POA: Diagnosis not present

## 2014-05-21 DIAGNOSIS — G894 Chronic pain syndrome: Secondary | ICD-10-CM | POA: Diagnosis not present

## 2014-05-21 LAB — FERRITIN: FERRITIN: 11 ng/mL — AB (ref 22–322)

## 2014-05-21 MED ORDER — GABAPENTIN 300 MG PO CAPS: ORAL_CAPSULE | ORAL | Status: DC

## 2014-05-21 MED ORDER — HYDROCODONE-ACETAMINOPHEN 5-325 MG PO TABS: 1.0000 | ORAL_TABLET | Freq: Three times a day (TID) | ORAL | Status: DC | PRN

## 2014-05-21 NOTE — Patient Instructions (Addendum)
Take the gabapentin 300 mg twice daily. But after 2 weeks can increase the gabapentin on up to 3 times daily if not much better.  Continue the Requip  Iron deficiency is a known cause of restless legs, and you have had a little anemia in the past. We will check a iron level. However go ahead and begin on over-the-counter iron 1 pill twice daily for 2 weeks, then once daily  If not improving further we will need to make a referral to a neurologist  I will give you the refill for the pain medication today. However it is very important in the future that you always see Dr. Conley RollsLe for this or contact her as directed per office policy on pain medications

## 2014-05-21 NOTE — Progress Notes (Signed)
Subjective: 59 year old man who has a long history of restless leg problems, has been on Requip and gabapentin and other things for this. However over the last couple of months his gotten progressively worse. Especially in the afternoon his right leg just gets a jumping around on them and being totally uncomfortable and cannot be calmed down. The left leg has some of this, but more on the right. He has problems sometimes during his sleep and cannot rest because of it.  Objective: Circulation is good as right leg. Pulses are good. Sensory grossly intact.  Assessment: Restless leg syndrome, not relieved by current medications Chronic pain syndrome Anemia  Plan: Is only on 300 mg a day of gabapentin, and that can be increased considerably if needed.  Will continue the same dose of Requip  I deficiency could be the cause of his symptoms. Will check a ferritin level. Begin on some iron anyhow.  He was supposed to have called about now to get Dr. Conley RollsLe to refill his pain medicine per his chart. He did not call her since he is coming in today. She is not here. I did refill it, but told him that in the future he always has to check with her for refills.  Check ferritin level and have him begin taking some iron daily

## 2014-05-22 ENCOUNTER — Telehealth: Payer: Self-pay

## 2014-05-22 DIAGNOSIS — G2581 Restless legs syndrome: Secondary | ICD-10-CM

## 2014-05-22 NOTE — Telephone Encounter (Signed)
Patient called to state that his new prescription for gabapentin was incorrect.  The original rx was "Take one twice daily for 2 weeks, then increase to one 3 times daily for restless leg syndrome."  He said it was supposed to be increased to three tablets in the morning and at bedtime for two weeks, and then 3 tablets three times a day.  [facility] [address]  Please advise.  Thank you.  CB#: [phone]

## 2014-05-22 NOTE — Telephone Encounter (Signed)
gabapentin (NEURONTIN) 300 MG capsule [[id number]][[id number]]      Order Details    Dose, Route, Frequency: As Directed    Dispense Quantity:  60 capsule Refills:  4 Fills Remaining:  4          Sig: Take one twice daily for 2 weeks, then increase to one 3 times daily for restless leg syndrome         Written Date:  05/21/14 Expiration Date:  05/21/15     Start Date:  05/21/14 End Date:  --     Ordering Provider:  -- Authorizing Provider:  Peyton Najjaravid H Hopper, MD Ordering User:  Peyton Najjaravid H Hopper, MD          Diagnosis Association: RLS (restless legs syndrome) (333.94)             Original Order:  gabapentin (NEURONTIN) 100 MG capsule [[id number]][[id number]]      Dr. Alwyn RenHopper please advise.

## 2014-05-22 NOTE — Telephone Encounter (Signed)
Pt was checking on status of this message. Please advise at [phone]

## 2014-05-23 NOTE — Telephone Encounter (Signed)
It looks like Dr Conley RollsLe increased pts dose of gabapentin to 900 qhs at 01/31/14 visit. But then at the 04/14/14 visit with Dr Conley RollsLe the med list shows he is only taking 300 qhs. This is probably why Dr Alwyn RenHopper thought he was only on 300. I think this needs to wait to have Dr Alwyn RenHopper or better yet Dr Conley RollsLe address as I'm not sure what dose he has been on and do not want to rapidly increase this medication in case he was really only on 300. Thanks.

## 2014-05-23 NOTE — Telephone Encounter (Signed)
Dr Alwyn RenHopper, your OV notes indicate that you wish to increase pt's dosage of gabapentin. He was previously taking 900 mg Qhs (but none during the day) from Dr Conley RollsLe. Pt's daily dose would actually reduce (total of 600 mg daily) to begin with the way you have written, and then back to total of 900 mg daily just spread out into three times a day. Pharm called and reported that pt indicated he is out of medication. Can someone else review this in Dr Hopper's absence or does it need to wait for him?

## 2014-05-26 MED ORDER — GABAPENTIN 300 MG PO CAPS: ORAL_CAPSULE | ORAL | Status: DC

## 2014-05-26 NOTE — Telephone Encounter (Signed)
I spoke to Antonio Cabrera and he reported that Dr Alwyn RenHopper was correct in that he was on the 100 mg capsules and he took 1 of these TID. I advised Antonio Cabrera that what Dr Alwyn RenHopper sent in IS an increase then bc he sent in 300 mg capsules instead of just 100 mg capsules. So taking one 300 mg capsule BID for 2 weeks and then one 300 mg capsule TID. Antonio Cabrera stated that the pharm is the one that was confused, but [facility] [address], and I am resending orig Rx as written by Dr Alwyn RenHopper to new pharm.

## 2014-05-26 NOTE — Telephone Encounter (Signed)
Call patient:  I understood from what he told me are from his truck and he was on 100 mg three times daily. If indeed he is taking 300 mg three times  He can increase that to four times daily  By taking one in the morning, one afternoon, and two at night.  He is to let us know Harry's doing before further increases.  It will take several weeks to tell.

## 2014-05-26 NOTE — Addendum Note (Signed)
Addended by: Sheppard PlumberBRIGGS, Thy Gullikson A on: 05/26/2014 03:48 PM   Modules accepted: Orders

## 2014-06-04 MED ORDER — GABAPENTIN 300 MG PO CAPS: ORAL_CAPSULE | ORAL | Status: AC

## 2014-06-04 NOTE — Telephone Encounter (Signed)
Pt called back and corrected what he had told me last time on 3/14. When he advised me that he had been on the gabapentin 100 mg TID, he was talking about in the past, not immediately before he saw Dr Alwyn RenHopper. When he came in to see Dr Alwyn RenHopper he had been taking the 300 mg capsules, 3 caps Qhs (total of 900 mg each day). In order to increase from that dosage

## 2014-06-04 NOTE — Addendum Note (Signed)
Addended by: Sheppard PlumberBRIGGS, Ethelbert Thain A on: 06/04/2014 03:16 PM   Modules accepted: Orders

## 2014-06-04 NOTE — Telephone Encounter (Signed)
In order to increase from that dosage ( 900 total mg a day,which pt advised me he has been taking up until today) I will go ahead and send in the Rx that Dr Alwyn RenHopper instr'd to send if this was the case: Gabapentin 300 mg, 1 cap Q morn, 1 cap Q afternoon, and 2 caps Qhs. Dr Conley RollsLe, I'm forwarding this to you FYI since this is your pt and you are managing his therapy.

## 2014-06-10 ENCOUNTER — Telehealth: Payer: Self-pay

## 2014-06-10 ENCOUNTER — Other Ambulatory Visit: Payer: Self-pay

## 2014-06-10 MED ORDER — TRAZODONE HCL 50 MG PO TABS: 50.0000 mg | ORAL_TABLET | Freq: Three times a day (TID) | ORAL | Status: AC

## 2014-06-10 NOTE — Telephone Encounter (Signed)
Dr Conley RollsLe, pt wanted me to let you know about his spilling the requip so am forwarding it FYI.

## 2014-06-10 NOTE — Telephone Encounter (Signed)
Pt called about several things. First he spilled bottle of requip in the sink when he was shaving and he will need a refill. I advised he has refills available and that pharm should be able to put in a special code or call ins company to see if they will cover the replacement, but I am not sure what outcome will be. Pt also wanted to f/up on referral to dermatologist that he stated he has not heard back on. I checked referral notes which state pt had appt on 04/16/14 and that "pt is aware". Since pt was not aware, I gave him phone number to Dr Dorita SciaraLupton's office to reschedule with them. He will call me back if there is a problem with this. Pt also needs a RF of trazadone. Sent in Rfs.

## 2014-06-23 ENCOUNTER — Other Ambulatory Visit: Payer: Self-pay

## 2014-06-23 DIAGNOSIS — F411 Generalized anxiety disorder: Secondary | ICD-10-CM

## 2014-06-23 NOTE — Telephone Encounter (Signed)
Patient needs a [facility] [address]  Patient would also like for Sheppard PlumberBarbara Briggs to call him about a medication question. [phone]

## 2014-06-23 NOTE — Telephone Encounter (Signed)
Got a fax requesting Rf of diazepam from [facility] [address]. We need to verify which pharm is correct.

## 2014-06-24 ENCOUNTER — Encounter (HOSPITAL_COMMUNITY): Payer: Self-pay

## 2014-06-24 ENCOUNTER — Emergency Department (HOSPITAL_COMMUNITY)
Admission: EM | Admit: 2014-06-24 | Discharge: 2014-06-24 | Disposition: A | Discharge status: Home / Self Care | Payer: Federal, State, Local not specified - PPO | Attending: Emergency Medicine | Admitting: Emergency Medicine

## 2014-06-24 DIAGNOSIS — Z23 Encounter for immunization: Secondary | ICD-10-CM | POA: Diagnosis not present

## 2014-06-24 DIAGNOSIS — Z9889 Other specified postprocedural states: Secondary | ICD-10-CM | POA: Diagnosis not present

## 2014-06-24 DIAGNOSIS — Y288XXA Contact with other sharp object, undetermined intent, initial encounter: Secondary | ICD-10-CM | POA: Diagnosis not present

## 2014-06-24 DIAGNOSIS — Y9389 Activity, other specified: Secondary | ICD-10-CM | POA: Insufficient documentation

## 2014-06-24 DIAGNOSIS — G709 Myoneural disorder, unspecified: Secondary | ICD-10-CM | POA: Insufficient documentation

## 2014-06-24 DIAGNOSIS — Y9289 Other specified places as the place of occurrence of the external cause: Secondary | ICD-10-CM | POA: Diagnosis not present

## 2014-06-24 DIAGNOSIS — Z880 Allergy status to penicillin: Secondary | ICD-10-CM | POA: Insufficient documentation

## 2014-06-24 DIAGNOSIS — G2581 Restless legs syndrome: Secondary | ICD-10-CM | POA: Diagnosis not present

## 2014-06-24 DIAGNOSIS — Y998 Other external cause status: Secondary | ICD-10-CM | POA: Diagnosis not present

## 2014-06-24 DIAGNOSIS — K219 Gastro-esophageal reflux disease without esophagitis: Secondary | ICD-10-CM | POA: Insufficient documentation

## 2014-06-24 DIAGNOSIS — F419 Anxiety disorder, unspecified: Secondary | ICD-10-CM | POA: Diagnosis not present

## 2014-06-24 DIAGNOSIS — Z79899 Other long term (current) drug therapy: Secondary | ICD-10-CM | POA: Insufficient documentation

## 2014-06-24 DIAGNOSIS — I509 Heart failure, unspecified: Secondary | ICD-10-CM | POA: Diagnosis not present

## 2014-06-24 DIAGNOSIS — G8929 Other chronic pain: Secondary | ICD-10-CM | POA: Insufficient documentation

## 2014-06-24 DIAGNOSIS — M1990 Unspecified osteoarthritis, unspecified site: Secondary | ICD-10-CM | POA: Diagnosis not present

## 2014-06-24 DIAGNOSIS — I252 Old myocardial infarction: Secondary | ICD-10-CM | POA: Diagnosis not present

## 2014-06-24 DIAGNOSIS — S61012A Laceration without foreign body of left thumb without damage to nail, initial encounter: Secondary | ICD-10-CM | POA: Diagnosis not present

## 2014-06-24 DIAGNOSIS — Z7951 Long term (current) use of inhaled steroids: Secondary | ICD-10-CM | POA: Diagnosis not present

## 2014-06-24 DIAGNOSIS — F329 Major depressive disorder, single episode, unspecified: Secondary | ICD-10-CM | POA: Diagnosis not present

## 2014-06-24 DIAGNOSIS — S60932A Unspecified superficial injury of left thumb, initial encounter: Secondary | ICD-10-CM | POA: Diagnosis present

## 2014-06-24 MED ORDER — DIAZEPAM 10 MG PO TABS: 10.0000 mg | ORAL_TABLET | Freq: Two times a day (BID) | ORAL | Status: AC | PRN

## 2014-06-24 MED ORDER — HYDROCODONE-ACETAMINOPHEN 5-325 MG PO TABS: 1.0000 | ORAL_TABLET | Freq: Four times a day (QID) | ORAL | Status: AC | PRN

## 2014-06-24 MED ORDER — LIDOCAINE HCL 2 % IJ SOLN
10.0000 mL | Freq: Once | INTRAMUSCULAR | Status: AC
  Administered 2014-06-24: 200 mg via INTRADERMAL
  Filled 2014-06-24: qty 20

## 2014-06-24 MED ORDER — OXYCODONE-ACETAMINOPHEN 5-325 MG PO TABS
1.0000 | ORAL_TABLET | Freq: Once | ORAL | Status: AC
  Administered 2014-06-24: 1 via ORAL
  Filled 2014-06-24: qty 1

## 2014-06-24 MED ORDER — TETANUS-DIPHTH-ACELL PERTUSSIS 5-2.5-18.5 LF-MCG/0.5 IM SUSP
0.5000 mL | Freq: Once | INTRAMUSCULAR | Status: AC
  Administered 2014-06-24: 0.5 mL via INTRAMUSCULAR
  Filled 2014-06-24: qty 0.5

## 2014-06-24 MED ORDER — FENTANYL CITRATE 0.05 MG/ML IJ SOLN
50.0000 ug | Freq: Once | INTRAMUSCULAR | Status: AC
  Administered 2014-06-24: 50 ug via INTRAVENOUS
  Filled 2014-06-24: qty 2

## 2014-06-24 NOTE — Telephone Encounter (Signed)
Pt CB and verified that he does use the [facility] on [address], NOT [address]. Pt reports that his RLS is getting worse, actually more like neuropathy now. He stated that he can hardly even feel his left foot. He has h/o ankle surgery in 2010 and said his foot has never felt right since. He also has problems with his back and she had back surgeries, but stated his back is not aching now. Pt would like to see a specialist about the lack of feeling in his foot. He doesn't know if he should see a podiatrist or another type of specialist, but would like a referral to whoever Dr Conley RollsLe thinks that he should see. Dr Conley RollsLe, please also address RF of diazepam. Pt has set up appt for 06/27/14 to see Dr Clelia CroftShaw.

## 2014-06-24 NOTE — ED Provider Notes (Signed)
CSN: [id number]     Arrival date & time 06/24/14  1850 History  This chart was scribed for non-physician practitioner, Fayrene Helper, working with Gerhard Munch, MD by Richarda Overlie, ED Scribe. This patient was seen in room TR11C/TR11C and the patient's care was started at 7:40 PM.   Chief Complaint  Patient presents with  . Finger Injury   The history is provided by the patient. No language interpreter was used.   HPI Comments: Antonio Cabrera. is a 59 y.o. male with a history of arthritis, hypoglycemia, GERD, PTSD, MI and CHF who presents to the Emergency Department complaining of a left thumb injury that occurred approximately 1 hour ago. He states that he was cutting fruit and sliced through his left thumb but says he did not completely cut it off. Pt rates his pain as a 10/10 at this time. Pt states that he is RHD. He states he is unsure if he is UTD on his tetanus. Pt states that he is on no anticoagulation medication.    Past Medical History  Diagnosis Date  . Arthritis   . Depression   . Allergy   . Hypoglycemia   . GERD (gastroesophageal reflux disease)   . PTSD (post-traumatic stress disorder)   . RLS (restless legs syndrome)   . Neuromuscular disorder   . Chronic back pain   . Anxiety   . Myocardial infarction     unsure of when  . CHF (congestive heart failure)    Past Surgical History  Procedure Laterality Date  . Cholecystectomy  2000  . Cervical fusion      C4-C6  . Lumbar fusion      L5-S1  . Nissen fundoplication      x2  . Ankle arthroscopy    . Knee arthroscopy    . Hernia repair    . Vasectomy    . Spine surgery      Dr Laymond Purser ( decease-neurosurgeon in [place] age 56)   . Cardiac catheterization Bilateral 01/27/2015    normal cardiac cath  . Left and right heart catheterization with coronary angiogram N/A 01/27/2014    Procedure: LEFT AND RIGHT HEART CATHETERIZATION WITH CORONARY ANGIOGRAM;  Surgeon: Lennette Bihari, MD;  Location: [facility] CATH LAB;   Service: Cardiovascular;  Laterality: N/A;   Family History  Problem Relation Age of Onset  . Breast cancer Mother   . Cancer Mother   . Stroke Father   . Hypertension Father   . Diabetes Father   . Alzheimer's disease Father   . Alcohol abuse Brother   . Colon cancer Neg Hx   . Esophageal cancer Neg Hx   . Rectal cancer Neg Hx   . Stomach cancer Neg Hx    History  Substance Use Topics  . Smoking status: Never Smoker   . Smokeless tobacco: Never Used  . Alcohol Use: Yes     Comment: occasional-socially    Review of Systems  Skin: Positive for wound.  Neurological: Negative for numbness.   Allergies  Antihistamines, chlorpheniramine-type; Decongestant; Milk-related compounds; and Penicillins  Home Medications   Prior to Admission medications   Medication Sig Start Date End Date Taking? Authorizing Provider  diazepam (VALIUM) 10 MG tablet Take 1 tablet (10 mg total) by mouth every 12 (twelve) hours as needed for anxiety. May refill 02/10/14 06/24/14   Chelle S Jeffery, PA-C  fluticasone (FLONASE) 50 MCG/ACT nasal spray Place 2 sprays into both nostrils daily. 11/29/13   Thao P  Le, DO  gabapentin (NEURONTIN) 300 MG capsule Take 1 cap by mouth every morning, 1 cap every afternoon, and 2 caps every night at bedtime. 06/04/14   Peyton Najjaravid H Hopper, MD  HYDROcodone-acetaminophen (NORCO) 5-325 MG per tablet Take 1 tablet by mouth every 8 (eight) hours as needed for moderate pain. 05/21/14   Peyton Najjaravid H Hopper, MD  hyoscyamine (LEVSIN, ANASPAZ) 0.125 MG tablet Take 1 tablet (0.125 mg total) by mouth 3 (three) times daily as needed for cramping. 02/05/13   Lorre Munroeegina W Baity, NP  omeprazole (PRILOSEC) 20 MG capsule Take 1 capsule (20 mg total) by mouth daily. 11/29/13   Thao P Le, DO  potassium chloride SA (K-DUR,KLOR-CON) 20 MEQ tablet Take 1 tablet (20 mEq total) by mouth daily. 05/01/14   Jake BatheMark C Skains, MD  rOPINIRole (REQUIP) 2 MG tablet Take 1 tablet (2 mg total) by mouth at bedtime. Patient not  taking: Reported on 05/21/2014 12/07/13   Thao P Le, DO  rOPINIRole (REQUIP) 2 MG tablet Take 1 tablet (2 mg total) by mouth 2 (two) times daily. 05/20/14   Thao P Le, DO  sucralfate (CARAFATE) 1 GM/10ML suspension Take 10 mLs (1 g total) by mouth 4 (four) times daily -  before meals and at bedtime. 04/01/14   Beverley FiedlerJay M Pyrtle, MD  torsemide (DEMADEX) 10 MG tablet Take 1 tablet (10 mg total) by mouth daily. Take one tablet daily 05/01/14   Jake BatheMark C Skains, MD  traZODone (DESYREL) 50 MG tablet Take 1 tablet (50 mg total) by mouth 3 (three) times daily. 06/10/14   Thao P Le, DO   BP 131/88 mmHg  Pulse 107  Temp(Src) 98.4 F (36.9 C) (Oral)  Resp 16  SpO2 100% Physical Exam  Constitutional: He is oriented to person, place, and time. He appears well-developed and well-nourished.  HENT:  Head: Normocephalic and atraumatic.  Eyes: Right eye exhibits no discharge. Left eye exhibits no discharge.  Neck: No tracheal deviation present.  Cardiovascular: Normal rate.   Pulmonary/Chest: Effort normal. No respiratory distress.  Abdominal: He exhibits no distension.  Neurological: He is alert and oriented to person, place, and time.  Intact sensation.   Skin: Skin is warm and dry.  Less than 1cm superficial laceration noted to the pad of left thumb without any bone or joint involvement. No foreign object noted  Psychiatric: He has a normal mood and affect. His behavior is normal.  Nursing note and vitals reviewed.   ED Course  Procedures  DIAGNOSTIC STUDIES: Oxygen Saturation is 100% on RA, normal by my interpretation.    COORDINATION OF CARE: 7:45 PM Discussed treatment plan with pt at bedside and pt agreed to plan. Discussed the suture procedure with the pt and pt agreed to plan.   LACERATION REPAIR Performed by: Fayrene HelperRAN,Bon Dowis Authorized byFayrene Helper: Yuri Fana Consent: Verbal consent obtained. Risks and benefits: risks, benefits and alternatives were discussed Consent given by: patient Patient identity  confirmed: provided demographic data Prepped and Draped in normal sterile fashion Wound explored  Laceration Location: L thumb  Laceration Length: 1cm  No Foreign Bodies seen or palpated  Anesthesia: digital block  Local anesthetic: lidocaine 2% w/o epinephrine  Anesthetic total: 2 ml  Irrigation method: syringe Amount of cleaning: standard  Skin closure: prolene 5.0  Number of sutures: 2  Technique: simple interrupted  Patient tolerance: Patient tolerated the procedure well with no immediate complications.   Labs Review Labs Reviewed - No data to display  Imaging Review No results found.  EKG Interpretation None      MDM   Final diagnoses:  Thumb laceration, left, initial encounter   BP 131/88 mmHg  Pulse 107  Temp(Src) 98.4 F (36.9 C) (Oral)  Resp 16  SpO2 100%   I personally performed the services described in this documentation, which was scribed in my presence. The recorded information has been reviewed and is accurate.       Fayrene Helper, PA-C 06/24/14 2030  Gerhard Munch, MD 06/25/14 0000

## 2014-06-24 NOTE — Discharge Instructions (Signed)
Please have your sutures removed in 5 days.  Return if you develop infection of your thumb.  Fingertip Injuries and Amputations Fingertip injuries are common and often get injured because they are last to escape when pulling your hand out of harm's way. You have amputated (cut off) part of your finger. How this turns out depends largely on how much was amputated. If just the tip is amputated, often the end of the finger will grow back and the finger may return to much the same as it was before the injury.  If more of the finger is missing, your caregiver has done the best with the tissue remaining to allow you to keep as much finger as is possible. Your caregiver after checking your injury has tried to leave you with a painless fingertip that has durable, feeling skin. If possible, your caregiver has tried to maintain the finger's length and appearance and preserve its fingernail.  Please read the instructions outlined below and refer to this sheet in the next few weeks. These instructions provide you with general information on caring for yourself. Your caregiver may also give you specific instructions. While your treatment has been done according to the most current medical practices available, unavoidable complications occasionally occur. If you have any problems or questions after discharge, please call your caregiver. HOME CARE INSTRUCTIONS   You may resume normal diet and activities as directed or allowed.  Keep your hand elevated above the level of your heart. This helps decrease pain and swelling.  Keep ice packs (or a bag of ice wrapped in a towel) on the injured area for 15-20 minutes, 03-04 times per day, for the first two days.  Change dressings if necessary or as directed.  Clean the wound daily or as directed.  Only take over-the-counter or prescription medicines for pain, discomfort, or fever as directed by your caregiver.  Keep appointments as directed. SEEK IMMEDIATE MEDICAL  CARE IF:  You develop redness, swelling, numbness or increasing pain in the wound.  There is pus coming from the wound.  You develop an unexplained oral temperature above 102 F (38.9 C) or as your caregiver suggests.  There is a foul (bad) smell coming from the wound or dressing.  There is a breaking open of the wound (edges not staying together) after sutures or staples have been removed. MAKE SURE YOU:   Understand these instructions.  Will watch your condition.  Will get help right away if you are not doing well or get worse. Document Released: 01/19/2005 Document Revised: 05/23/2011 Document Reviewed: 12/19/2007 [facility] Patient Information 2015 [place] [place]. This information is not intended to replace advice given to you by your health care provider. Make sure you discuss any questions you have with your health care provider.

## 2014-06-24 NOTE — Telephone Encounter (Signed)
Rx printed at 104. Will bring it to 102 after clinic.  Meds ordered this encounter  Medications  . diazepam (VALIUM) 10 MG tablet    Sig: Take 1 tablet (10 mg total) by mouth every 12 (twelve) hours as needed for anxiety. May refill 02/10/14    Dispense:  60 tablet    Refill:  0

## 2014-06-24 NOTE — Telephone Encounter (Signed)
Faxed Rx and notified pt. 

## 2014-06-24 NOTE — ED Notes (Signed)
Per [facility], pt was cutting fruit and sliced through his left thumb cutting the tip of the thumb. Had it wrapped before EMS arrived. Bleeding through bandage.

## 2014-06-24 NOTE — Telephone Encounter (Signed)
Pt CB asking about his RF of diazepam because his foot is "driving his crazy" and he thinks that it might help to calm it down. He asked that I get this addressed ASAP. Can someone else please address in Dr Irwin BrakemanLe's absence?

## 2014-06-27 ENCOUNTER — Ambulatory Visit: Payer: Federal, State, Local not specified - PPO | Admitting: Family Medicine

## 2014-07-02 ENCOUNTER — Ambulatory Visit: Payer: Federal, State, Local not specified - PPO | Admitting: Family Medicine

## 2014-07-13 DEATH — deceased

## 2014-07-25 ENCOUNTER — Ambulatory Visit: Payer: Federal, State, Local not specified - PPO | Admitting: Family Medicine

## 2015-01-13 ENCOUNTER — Telehealth: Payer: Self-pay | Admitting: Family Medicine

## 2015-01-13 NOTE — Telephone Encounter (Signed)
[employer] forms filled out, he is deceased. Wife will pick it up.

## 2015-01-27 HISTORY — PX: CARDIAC CATHETERIZATION: SHX172

## 2015-02-27 IMAGING — CR DG FOOT COMPLETE 3+V*L*
3 series · 3 of 3 positions shown · non-contrast
Comparison: No comparison studies available.

CLINICAL DATA: Initial encounter for left fifth toe injury.

EXAM:
LEFT FOOT - COMPLETE 3+ VIEW

[AP]
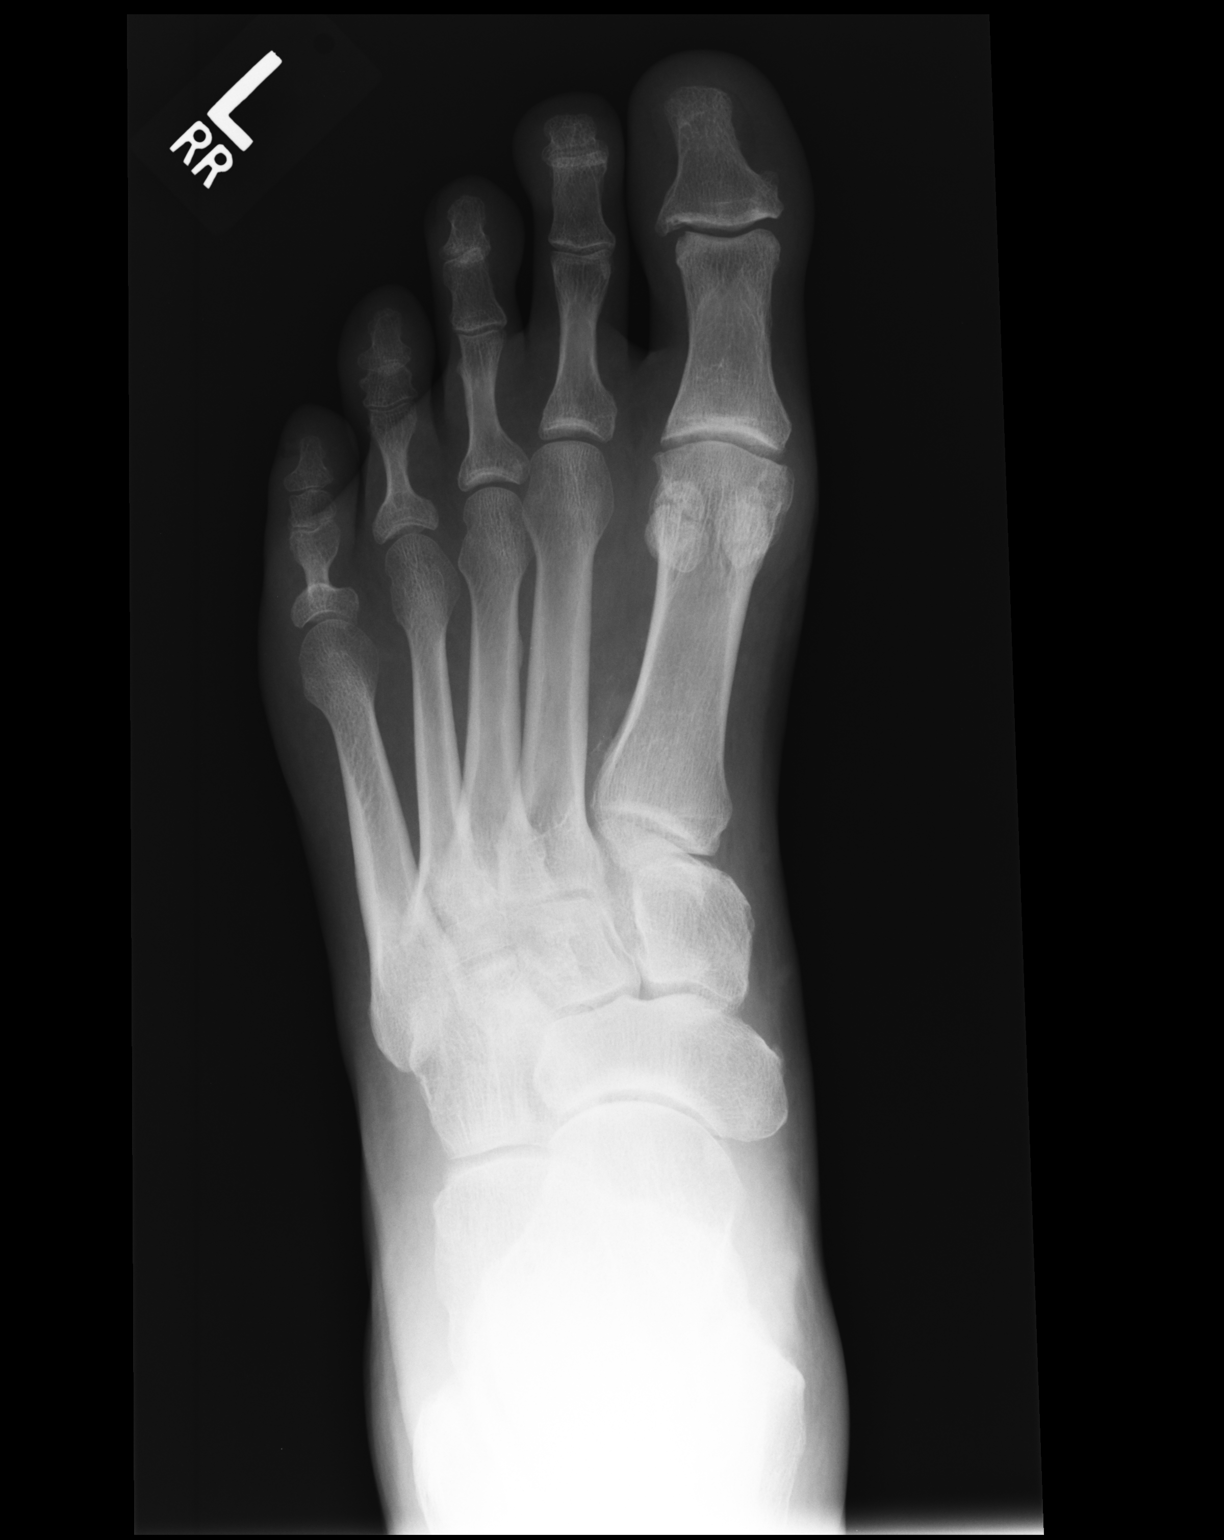

[ap obl int rot]
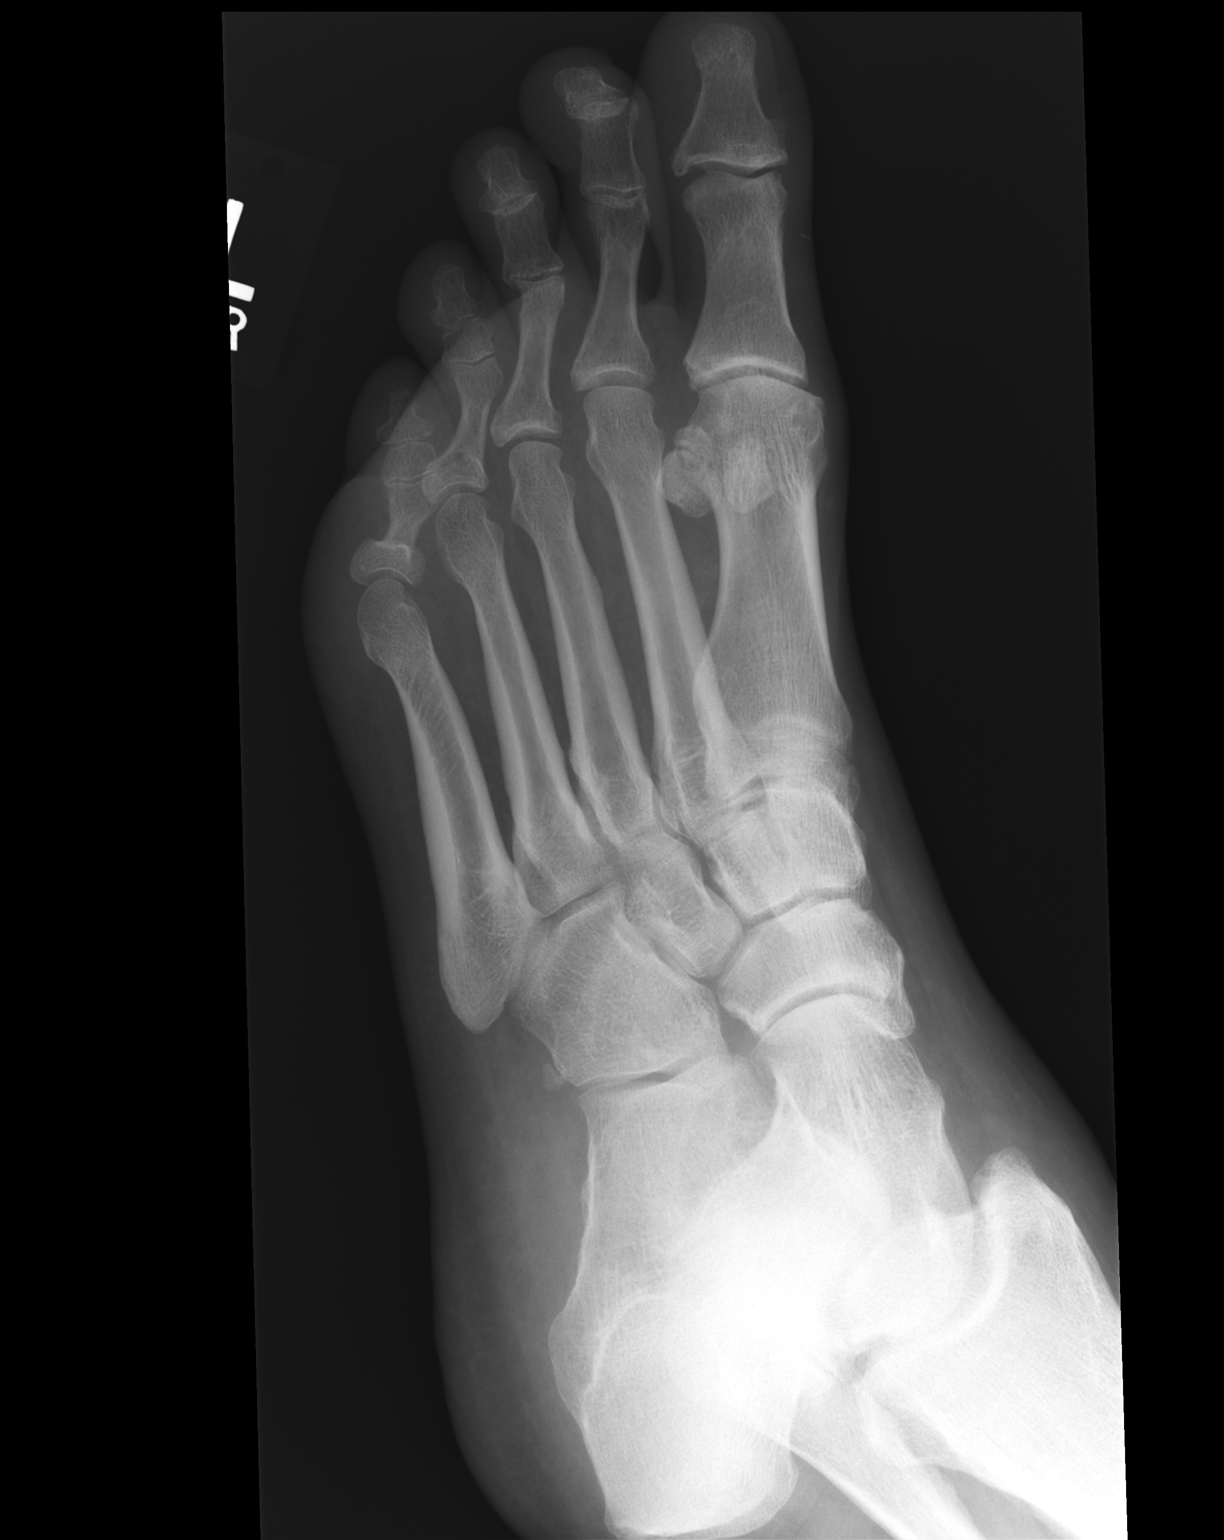

[lateral]
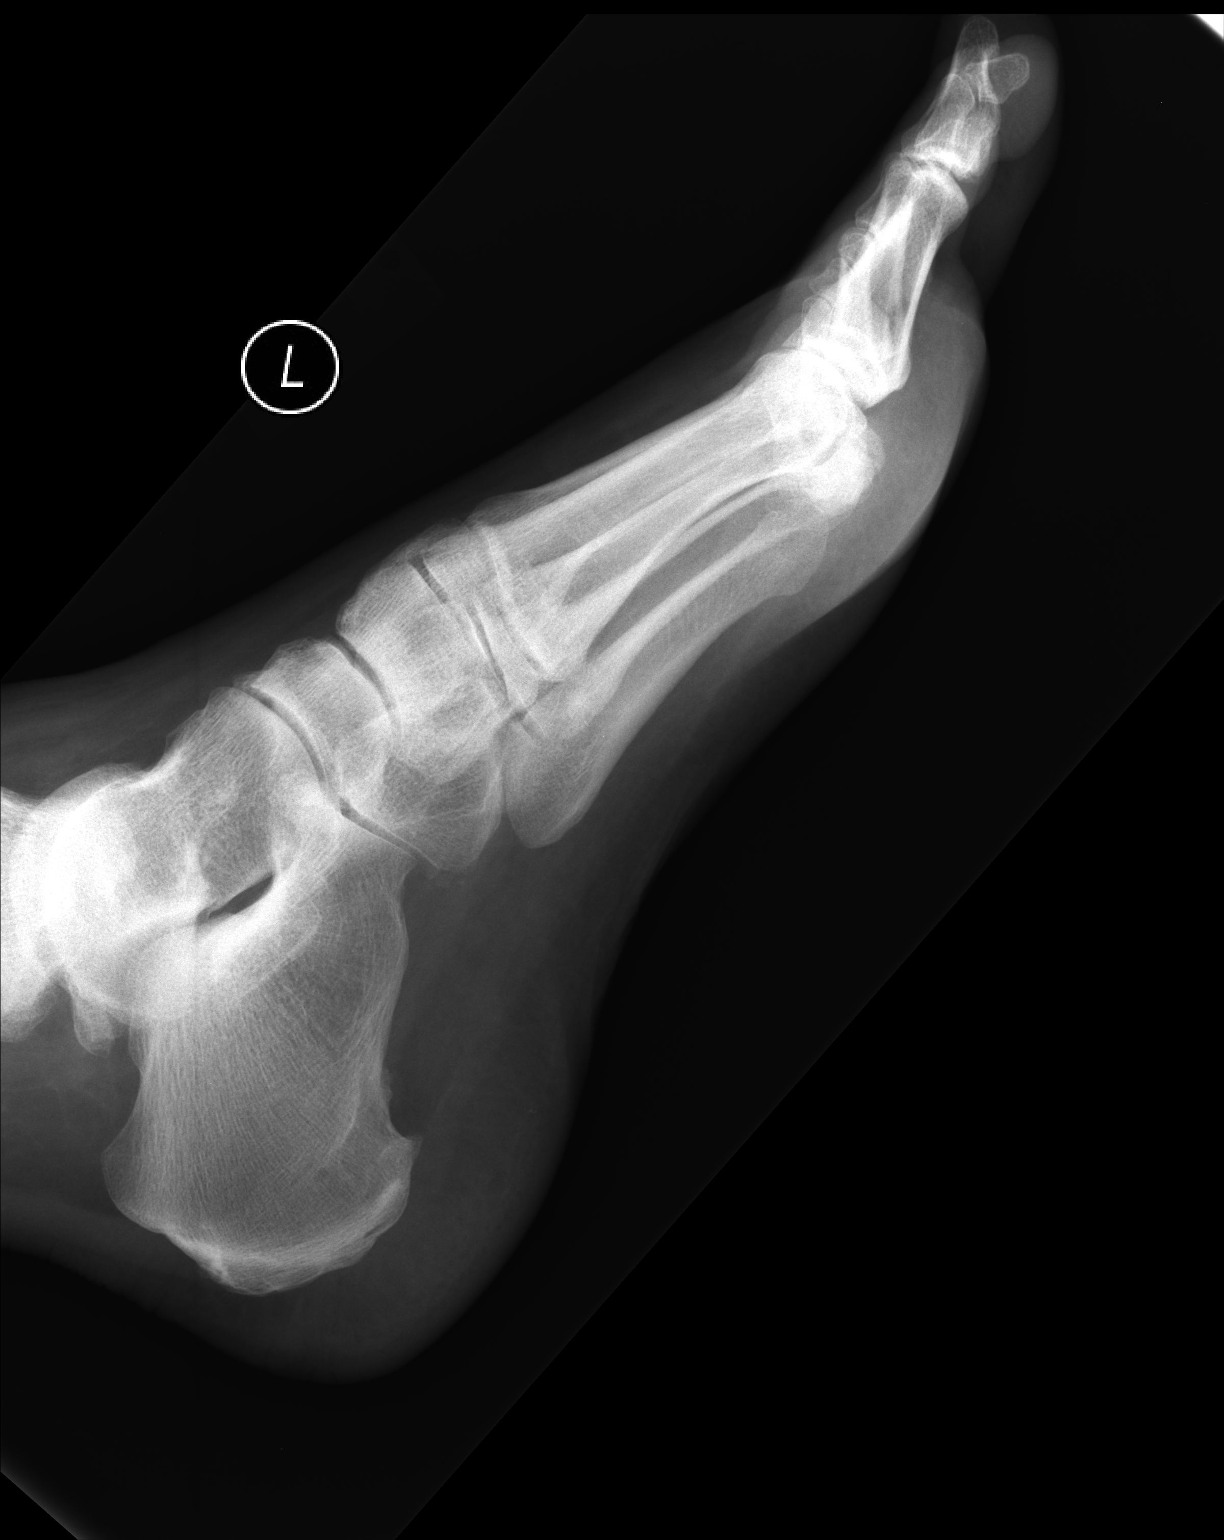

[3 of 3 positions shown; findings below may reference images not displayed]

FINDINGS: No evidence for fracture. No subluxation or dislocation.
Degenerative changes are seen in the MTP joint of the great toe. No
worrisome lytic or sclerotic osseous abnormality.
IMPRESSION: Negative.

## 2015-02-27 IMAGING — CR DG ABDOMEN 1V
1 series · 1 of 1 positions shown · non-contrast
Comparison: None.

CLINICAL DATA: Constipation and nausea.

EXAM:
ABDOMEN - 1 VIEW

[AP]
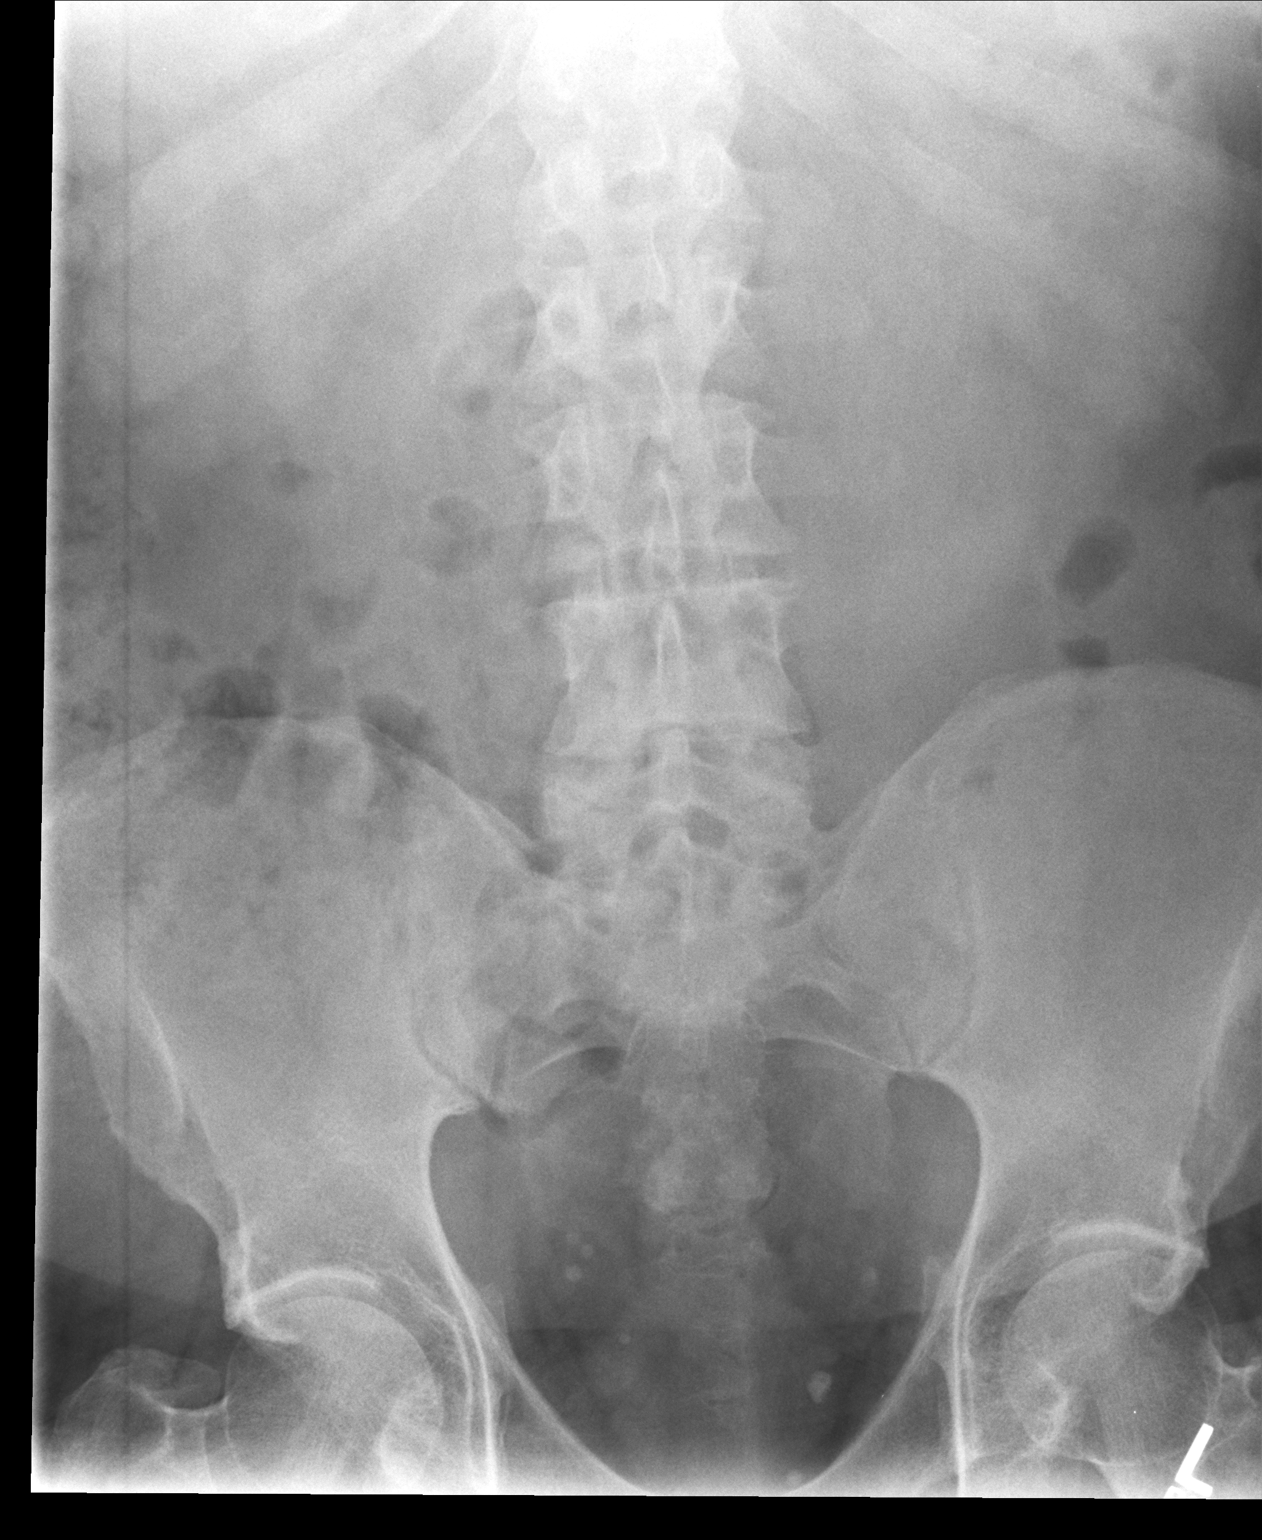

[1 of 1 positions shown; findings below may reference images not displayed]

FINDINGS: The bowel gas pattern is normal. No radio-opaque calculi or other
significant radiographic abnormality are seen.
IMPRESSION: Negative.

## 2015-03-24 IMAGING — DX DG CHEST 2V
2 series · 2 of 2 positions shown · non-contrast
Comparison: None.

CLINICAL DATA: Chest pain 3 weeks with shortness of breath vomiting
and cough for 2 days initial evaluation, nonsmoker

EXAM:
CHEST  2 VIEW

[chest lat]
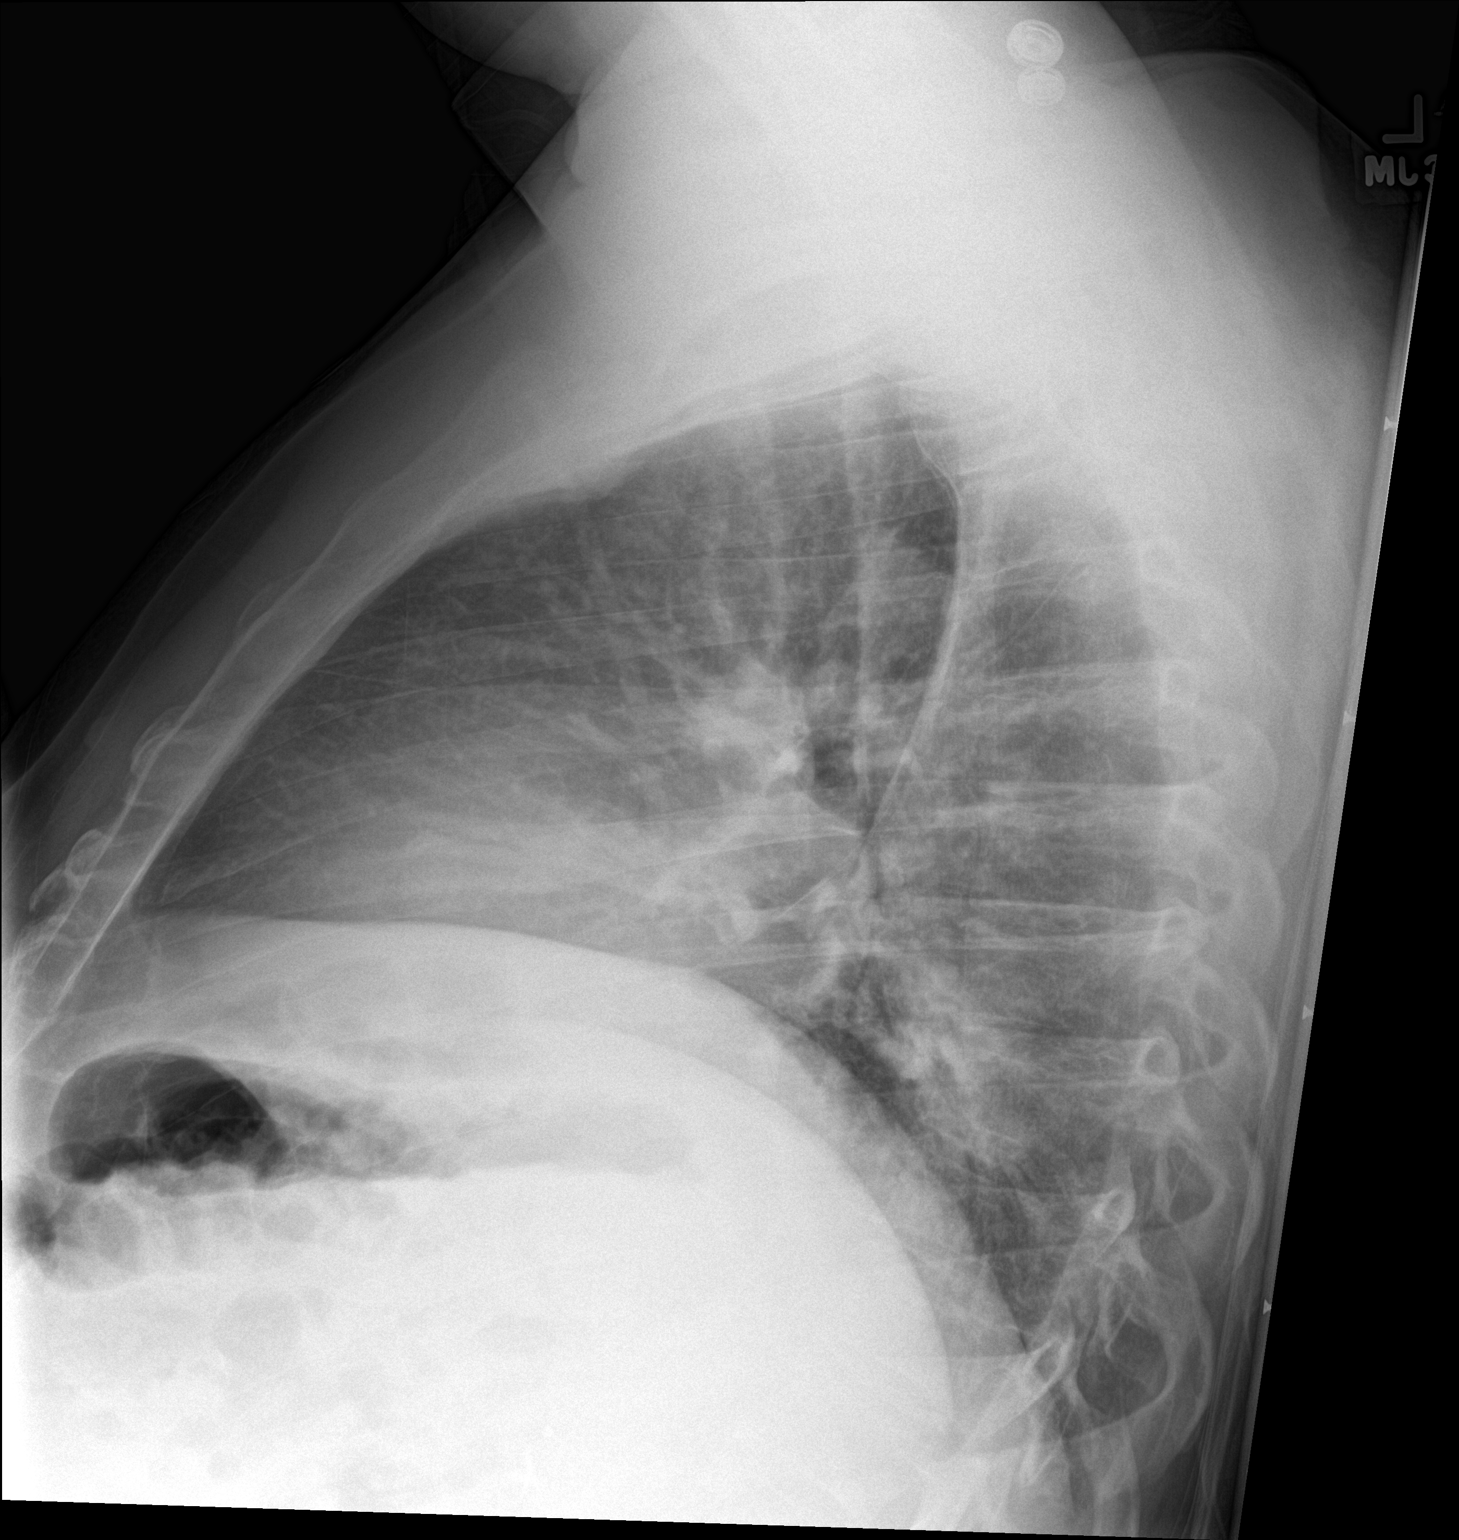

[chest ap]
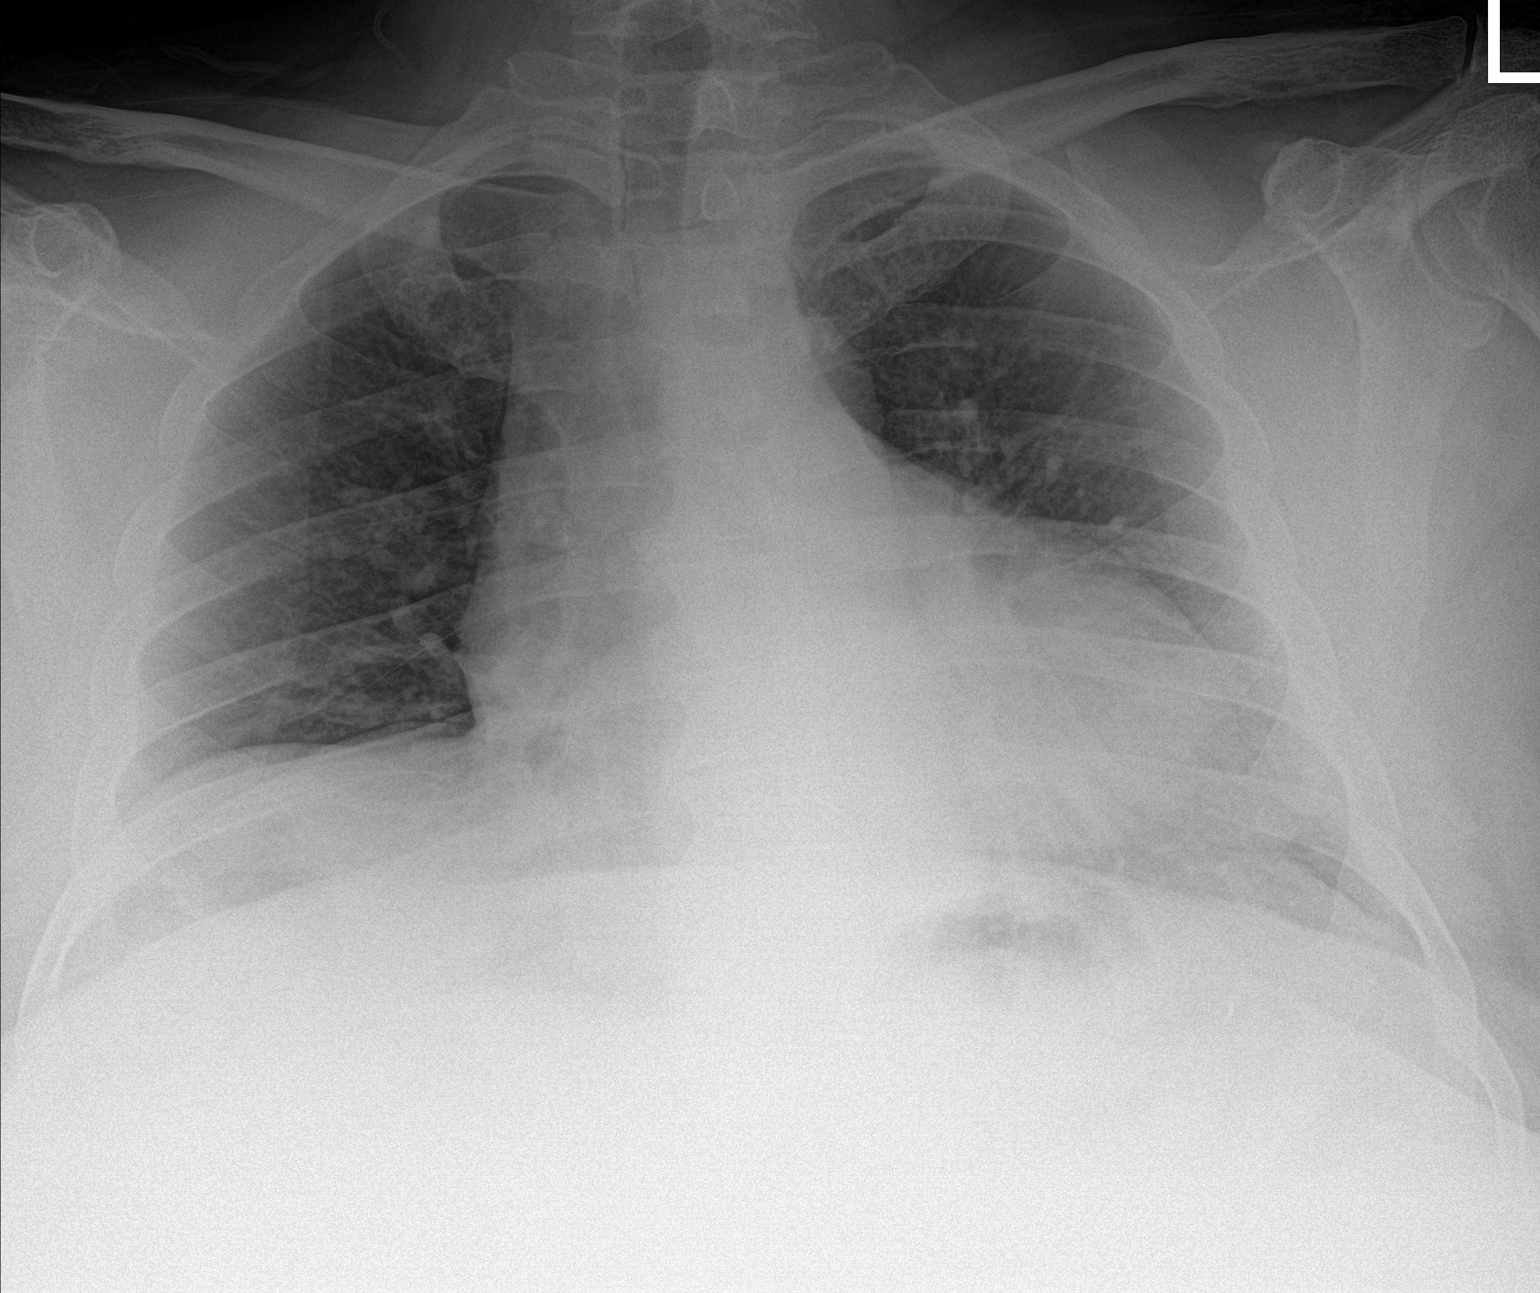

[2 of 2 positions shown; findings below may reference images not displayed]

FINDINGS: Limited inspiratory effect exaggerating heart size. Allowing for
this there is mild to moderate cardiac enlargement. Vascular pattern
within normal limits. Lungs clear. No effusion or consolidation.
IMPRESSION: Enlargement of cardiac silhouette.  Otherwise no acute findings.
